# Patient Record
Sex: Male | Born: 1935 | Race: White | Hispanic: No | Marital: Married | Smoking: Former smoker
Health system: Southern US, Community
[De-identification: ages and names within clinical notes are randomized; demographics above are authoritative.]

## PROBLEM LIST (undated history)

## (undated) DIAGNOSIS — I2510 Atherosclerotic heart disease of native coronary artery without angina pectoris: Secondary | ICD-10-CM

## (undated) DIAGNOSIS — G20 Parkinson's disease: Secondary | ICD-10-CM

## (undated) DIAGNOSIS — D649 Anemia, unspecified: Secondary | ICD-10-CM

## (undated) DIAGNOSIS — R809 Proteinuria, unspecified: Secondary | ICD-10-CM

## (undated) DIAGNOSIS — E1149 Type 2 diabetes mellitus with other diabetic neurological complication: Secondary | ICD-10-CM

## (undated) DIAGNOSIS — I219 Acute myocardial infarction, unspecified: Secondary | ICD-10-CM

## (undated) DIAGNOSIS — Z8719 Personal history of other diseases of the digestive system: Secondary | ICD-10-CM

## (undated) DIAGNOSIS — G2581 Restless legs syndrome: Secondary | ICD-10-CM

## (undated) DIAGNOSIS — J309 Allergic rhinitis, unspecified: Secondary | ICD-10-CM

## (undated) DIAGNOSIS — I459 Conduction disorder, unspecified: Secondary | ICD-10-CM

## (undated) DIAGNOSIS — B029 Zoster without complications: Secondary | ICD-10-CM

## (undated) DIAGNOSIS — I480 Paroxysmal atrial fibrillation: Secondary | ICD-10-CM

## (undated) DIAGNOSIS — M1990 Unspecified osteoarthritis, unspecified site: Secondary | ICD-10-CM

## (undated) DIAGNOSIS — K219 Gastro-esophageal reflux disease without esophagitis: Secondary | ICD-10-CM

## (undated) DIAGNOSIS — I10 Essential (primary) hypertension: Secondary | ICD-10-CM

## (undated) DIAGNOSIS — G20A1 Parkinson's disease without dyskinesia, without mention of fluctuations: Secondary | ICD-10-CM

## (undated) DIAGNOSIS — E785 Hyperlipidemia, unspecified: Secondary | ICD-10-CM

## (undated) HISTORY — PX: COLONOSCOPY: SHX174

## (undated) HISTORY — DX: Type 2 diabetes mellitus with other diabetic neurological complication: E11.49

## (undated) HISTORY — DX: Anemia, unspecified: D64.9

## (undated) HISTORY — DX: Paroxysmal atrial fibrillation: I48.0

## (undated) HISTORY — DX: Unspecified osteoarthritis, unspecified site: M19.90

## (undated) HISTORY — DX: Essential (primary) hypertension: I10

## (undated) HISTORY — DX: Allergic rhinitis, unspecified: J30.9

## (undated) HISTORY — PX: ESOPHAGOGASTRODUODENOSCOPY: SHX1529

## (undated) HISTORY — DX: Restless legs syndrome: G25.81

## (undated) HISTORY — PX: PACEMAKER PLACEMENT: SHX43

## (undated) HISTORY — DX: Hyperlipidemia, unspecified: E78.5

## (undated) HISTORY — DX: Gastro-esophageal reflux disease without esophagitis: K21.9

## (undated) HISTORY — DX: Proteinuria, unspecified: R80.9

## (undated) HISTORY — DX: Zoster without complications: B02.9

## (undated) HISTORY — DX: Conduction disorder, unspecified: I45.9

## (undated) HISTORY — DX: Atherosclerotic heart disease of native coronary artery without angina pectoris: I25.10

## (undated) HISTORY — DX: Personal history of other diseases of the digestive system: Z87.19

## (undated) HISTORY — PX: CATARACT EXTRACTION: SUR2

## (undated) HISTORY — DX: Acute myocardial infarction, unspecified: I21.9

## (undated) HISTORY — PX: HERNIA REPAIR: SHX51

---

## 2004-08-22 ENCOUNTER — Ambulatory Visit: Payer: Self-pay | Admitting: Gastroenterology

## 2004-09-03 ENCOUNTER — Inpatient Hospital Stay: Payer: Self-pay | Admitting: Gastroenterology

## 2004-09-03 ENCOUNTER — Other Ambulatory Visit: Payer: Self-pay

## 2004-09-04 ENCOUNTER — Other Ambulatory Visit: Payer: Self-pay

## 2004-11-21 ENCOUNTER — Ambulatory Visit: Payer: Self-pay | Admitting: Gastroenterology

## 2008-12-04 ENCOUNTER — Ambulatory Visit: Payer: Self-pay | Admitting: Unknown Physician Specialty

## 2009-09-05 ENCOUNTER — Ambulatory Visit: Payer: Self-pay | Admitting: Ophthalmology

## 2009-10-24 ENCOUNTER — Ambulatory Visit: Payer: Self-pay | Admitting: Ophthalmology

## 2011-10-17 ENCOUNTER — Ambulatory Visit: Payer: Self-pay | Admitting: Gastroenterology

## 2011-11-06 ENCOUNTER — Ambulatory Visit: Payer: Self-pay | Admitting: Gastroenterology

## 2011-11-10 LAB — PATHOLOGY REPORT

## 2013-01-29 ENCOUNTER — Emergency Department: Payer: Self-pay | Admitting: Emergency Medicine

## 2013-01-29 ENCOUNTER — Ambulatory Visit: Payer: Self-pay | Admitting: Emergency Medicine

## 2013-02-01 ENCOUNTER — Emergency Department: Payer: Self-pay | Admitting: Emergency Medicine

## 2013-02-11 ENCOUNTER — Ambulatory Visit: Payer: Self-pay | Admitting: Family Medicine

## 2013-06-27 ENCOUNTER — Inpatient Hospital Stay: Payer: Self-pay | Admitting: Internal Medicine

## 2013-06-27 LAB — COMPREHENSIVE METABOLIC PANEL
Albumin: 3.4 g/dL (ref 3.4–5.0)
Anion Gap: 5 — ABNORMAL LOW (ref 7–16)
Bilirubin,Total: 0.5 mg/dL (ref 0.2–1.0)
Co2: 29 mmol/L (ref 21–32)
Creatinine: 0.71 mg/dL (ref 0.60–1.30)
EGFR (African American): >60
Glucose: 161 mg/dL — ABNORMAL HIGH (ref 65–99)
SGPT (ALT): 23 U/L (ref 12–78)
Sodium: 139 mmol/L (ref 136–145)

## 2013-06-27 LAB — CBC
MCH: 31.8 pg (ref 26.0–34.0)
Platelet: 128 10*3/uL — ABNORMAL LOW (ref 150–440)
RBC: 4.29 10*6/uL — ABNORMAL LOW (ref 4.40–5.90)
RDW: 13.3 % (ref 11.5–14.5)
WBC: 7.9 10*3/uL (ref 3.8–10.6)

## 2013-06-27 LAB — TSH: Thyroid Stimulating Horm: 2.51 u[IU]/mL

## 2013-06-27 LAB — TROPONIN I: Troponin-I: <0.02 ng/mL

## 2013-06-27 LAB — APTT
Activated PTT: 32.9 secs (ref 23.6–35.9)
Activated PTT: 100.4 secs — ABNORMAL HIGH (ref 23.6–35.9)

## 2013-06-28 LAB — BASIC METABOLIC PANEL
Anion Gap: 5 — ABNORMAL LOW (ref 7–16)
BUN: 17 mg/dL (ref 7–18)
Calcium, Total: 8.8 mg/dL (ref 8.5–10.1)
Co2: 28 mmol/L (ref 21–32)
Creatinine: 0.86 mg/dL (ref 0.60–1.30)
EGFR (African American): >60
EGFR (Non-African Amer.): >60
Glucose: 153 mg/dL — ABNORMAL HIGH (ref 65–99)
Potassium: 4 mmol/L (ref 3.5–5.1)
Sodium: 139 mmol/L (ref 136–145)

## 2013-06-28 LAB — CBC WITH DIFFERENTIAL/PLATELET
Basophil #: 0 10*3/uL (ref 0.0–0.1)
Basophil %: 0.5 %
Eosinophil %: 2 %
HGB: 13.1 g/dL (ref 13.0–18.0)
Lymphocyte #: 2 10*3/uL (ref 1.0–3.6)
MCH: 31.8 pg (ref 26.0–34.0)
MCHC: 34.8 g/dL (ref 32.0–36.0)
Monocyte #: 0.5 x10 3/mm (ref 0.2–1.0)
Monocyte %: 7.4 %
Neutrophil #: 4.1 10*3/uL (ref 1.4–6.5)
Neutrophil %: 60 %
Platelet: 131 10*3/uL — ABNORMAL LOW (ref 150–440)
WBC: 6.8 10*3/uL (ref 3.8–10.6)

## 2013-06-28 LAB — TSH: Thyroid Stimulating Horm: 2.55 u[IU]/mL

## 2013-06-28 LAB — LIPID PANEL
Cholesterol: 164 mg/dL (ref 0–200)
Ldl Cholesterol, Calc: 76 mg/dL (ref 0–100)
VLDL Cholesterol, Calc: 29 mg/dL (ref 5–40)

## 2014-03-04 DIAGNOSIS — E785 Hyperlipidemia, unspecified: Secondary | ICD-10-CM | POA: Insufficient documentation

## 2014-03-04 DIAGNOSIS — I10 Essential (primary) hypertension: Secondary | ICD-10-CM | POA: Insufficient documentation

## 2014-03-04 DIAGNOSIS — E119 Type 2 diabetes mellitus without complications: Secondary | ICD-10-CM | POA: Insufficient documentation

## 2014-03-04 DIAGNOSIS — J309 Allergic rhinitis, unspecified: Secondary | ICD-10-CM | POA: Insufficient documentation

## 2014-03-04 DIAGNOSIS — K219 Gastro-esophageal reflux disease without esophagitis: Secondary | ICD-10-CM | POA: Insufficient documentation

## 2014-03-04 DIAGNOSIS — G2581 Restless legs syndrome: Secondary | ICD-10-CM | POA: Insufficient documentation

## 2014-03-04 DIAGNOSIS — I2510 Atherosclerotic heart disease of native coronary artery without angina pectoris: Secondary | ICD-10-CM | POA: Insufficient documentation

## 2014-03-04 DIAGNOSIS — G629 Polyneuropathy, unspecified: Secondary | ICD-10-CM | POA: Insufficient documentation

## 2014-03-06 DIAGNOSIS — I480 Paroxysmal atrial fibrillation: Secondary | ICD-10-CM | POA: Insufficient documentation

## 2014-03-06 DIAGNOSIS — R809 Proteinuria, unspecified: Secondary | ICD-10-CM | POA: Insufficient documentation

## 2014-03-06 DIAGNOSIS — D649 Anemia, unspecified: Secondary | ICD-10-CM | POA: Insufficient documentation

## 2014-04-07 DIAGNOSIS — G20 Parkinson's disease: Secondary | ICD-10-CM | POA: Insufficient documentation

## 2014-04-10 ENCOUNTER — Ambulatory Visit: Payer: Self-pay | Admitting: Neurology

## 2014-12-29 NOTE — H&P (Signed)
PATIENT NAME:  Johnny Bowen, Johnny Bowen MR#:  [id number] DATE OF BIRTH:  [birth year]  DATE OF ADMISSION:  06/27/2013  PRIMARY CARE PHYSICIAN: Dr. Mickey Farber at [facility], [place].   PRIMARY CARDIOLOGIST: Dr. Harold Hedge at [facility], [place].   REFERRING: Emergency Room physician: Dr. Jene Every.   CHIEF COMPLAINT:  Dizziness.   HISTORY OF PRESENT ILLNESS: An 81 year old male with a past medical history of hypertension, acute myocardial infarction 2 times 27 years ago, diabetes type 2 and neuropathy and hyperlipidemia, who is living active and healthy life, had been feeling dizzy on and off for the last 3 to 4 weeks and was referred to Dr. Lady Gary. Dr. Lady Gary gave him a Holter monitor for 24 hours which did not report any significant events. The plan was to follow-up in the clinic next week, but meanwhile since yesterday evening, he started having very frequently this type of episodes where he feels his head is light and he will pass out, but he never had actual episode of loss of consciousness. He had almost 7 to 8 episodes overnight without any chest pain or palpitations feeling, and so he decided to come to the Emergency Room today. In the Emergency Room he was found having atrial flutter, which is new. His ventricular response was slow and he had almost 6 second pause, which is recorded in the ER monitoring, and so the ER physician spoke to Dr. Lady Gary. He advised to admit the patient and we will plan for further management.   REVIEW OF SYSTEMS:  CONSTITUTIONAL: Negative for fever, fatigue, weakness, pain or weight loss.  EYES: No blurring, double vision, pain or redness.  EARS, NOSE, THROAT: No tinnitus, ear pain or hearing loss.  RESPIRATORY: No cough, wheezing, hemoptysis or shortness of breath.  CARDIOVASCULAR: No chest pain, edema, arrhythmia or palpitations feeling but was feeling dizzy.  GASTROINTESTINAL: No nausea, vomiting, diarrhea or abdominal pain  GENITOURINARY: No dysuria,  hematuria or increased frequency.  ENDOCRINE: No increased sweating, heat or cold intolerance.  MUSCULOSKELETAL: No pain or swelling in the joints.  NEUROLOGICAL: No numbness, weakness, dysarthria or epilepsy tremors or vertigo.   PSYCHIATRIC: No anxiety, insomnia or bipolar disorder.   PAST MEDICAL HISTORY:  1. Diabetes type 2 and neuropathy.  2. Hyperlipidemia.  3. Restless leg syndrome.  4. Acute myocardial infarction 2 times, which was 27 years ago and on cardiac catheterization he was told that the part of the heart which is affected is already dead and so they did not put any stent or did not suggest any surgery.  5. Hypertension.  6. Hypercholesterolemia.   PAST SURGICAL HISTORY: Hernia surgery many years ago.   ALLERGIES: No medications.   SOCIAL HISTORY: Denies any drinking, smoking habits or any illegal drug use. He was a Engineer, civil (consulting) and retired currently. Lives with his wife.   FAMILY HISTORY: Positive for diabetes.   HOME MEDICATIONS:  1. Pravastatin 40 mg oral once a day.  2. Pramipexole 0.75 mg oral tablet once a day.  3. Atenolol 50 mg once a day.  4. Aspirin 81 mg once a day.  5. Acetaminophen and oxycodone 10 mg oral tablet every six hours as needed for pain.   PHYSICAL EXAMINATION: VITAL SIGNS: In the ER, temperature 97.7, pulse 63, respirations 18, blood pressure 153/78 and pulse oximetry 95% on room air.  GENERAL: The patient is fully alert and oriented to time, place and person and he is cooperative with history taking and physical examination.  HEENT:  Head and neck atraumatic. Conjunctiva pink. Oral mucosa moist.  NECK: Supple. No JVD.  RESPIRATORY: Bilateral clear and equal air entry.  CARDIOVASCULAR: S1, S2 present. Irregular. No murmur appreciated.   ABDOMEN: Soft, nontender. Bowel sounds present. No organomegaly.  SKIN: No rashes.  LEGS: No edema.  NEUROLOGICAL: Power 5/5. Follows commands. No tremors or rigidity.  PSYCHIATRIC: Does not  appear in any acute psychiatric illness at this time.   IMPORTANT LABORATORY RESULTS: Glucose 161, BUN 14, creatinine 0.71, sodium 139, potassium 4.1, chloride 105, CO2 of 29, anion gap 5, calcium 8.9.   Total protein 6.5, albumin 3.4, bilirubin 0.5 alkaline phosphate 82, SGOT 20 and SGPT 23. Troponin less than 0.02. TSH 2.51. WBC 7.9, hemoglobin 13.7, platelet count 128 and MCV is 92.   Chest x-ray, portable, shows a mild elevation of the right hemidiaphragm may be associated with basilar atelectasis. No evidence of pneumonia. Follow-up PA and lateral chest x-ray with deep inspiration would be of  value.   EKG: As per ER shows atrial flutter with irregular ventricular response, premature ventricular conduction. Also, the ventricular response rate is around 70 and there is a tracing by ER which shows almost 6 second pause with atrial fibrillation baseline activities.   ASSESSMENT AND PLAN: An 81 year old male with past medical history of remote acute myocardial infarction in the past, hypertension, hyperlipidemia, diabetes, which is diet controlled, presented to the Emergency Room with episode of feeling dizzy for the last 3 to 4 weeks and had undergone Holter monitoring for 24 hours without any significant racing with increased episode for the last few hours and came to the Emergency Room, found having atrial flutter, which is new and also had a pause which is recorded of up to 6 seconds.  1. Atrial flutter. This is new onset. There is some intraventricular conduction delay and ventricular response rate is around 60 to 70. The patient also had a pause, so we will hold beta blocker and there is no need for rate control at this time. Blood pressure is stable. We will follow serial troponins, monitor him on telemetry and give heparin IV drip. I spoke to Dr. Lady Gary and he agrees to see the patient today as soon as possible. Meanwhile, we will get echocardiogram.  2. Atrioventricular block and pause. The  patient has almost 6 second pause in tracings in ER. We will hold his atenolol at this time and monitor on telemetry. Blood pressure is stable. The patient is fully alert and oriented.  3. Hypertension. The patient was taking atenolol. The patient is currently stable so we will hold atenolol because of cardiac reasons.  4. Diabetes type 2. The patient says his diabetes was diet controlled. We will  check his blood sugar and give insulin sliding scale coverage if needed.  5. Hyperlipidemia. We will continue pravastatin in the hospital.   CONDITION ON ADMISSION: Stable.   CODE STATUS: FULL CODE.   TOTAL TIME SPENT ON THIS ADMISSION: 50 minutes.     ____________________________ Hope Pigeon Elisabeth Pigeon, MD vgv:sg D: 06/27/2013 10:16:02 ET T: 06/27/2013 11:41:24 ET JOB#: [id number]  cc: Hope Pigeon. Elisabeth Pigeon, MD, <Dictator> Darlin Priestly. Lady Gary, MD Altamese Dilling MD ELECTRONICALLY SIGNED 07/06/2013 13:59

## 2014-12-29 NOTE — Consult Note (Signed)
   Present Illness Patient is an 81 year old male with history of coronary artery disease status post PCI who presents for followup visit. Has hyperlipidemia as well as restless leg syndrome. The patient was evalluated as an outptient wiht a holter monitor that was negative for any arrythmia although, the patient had no symptoms with this monitor in place. He states the symptoms last approximately 10 seconds. Pt presented to the er with complaints of intermittant symptoms again over the past 12 hours. While in the er, he atrial flutter with several 5 second pauses. These pauses reproduced his symptoms.   Physical Exam:  GEN well developed, well nourished, no acute distress   HEENT red conjunctivae   NECK supple   RESP normal resp effort   CARD Regular rate and rhythm  Bradycardic   ABD denies tenderness  normal BS   LYMPH negative axillae   EXTR negative cyanosis/clubbing, negative edema   SKIN normal to palpation   NEURO cranial nerves intact, motor/sensory function intact   PSYCH A+O to time, place, person   Review of Systems:  Subjective/Chief Complaint near syncope   General: No Complaints   Skin: No Complaints   ENT: No Complaints   Eyes: No Complaints   Neck: No Complaints   Respiratory: No Complaints   Cardiovascular: Palpitations   Gastrointestinal: No Complaints   Genitourinary: No Complaints   Vascular: No Complaints   Musculoskeletal: No Complaints   Neurologic: Dizzness   Hematologic: No Complaints   Endocrine: No Complaints   Psychiatric: No Complaints   Review of Systems: All other systems were reviewed and found to be negative   Medications/Allergies Reviewed Medications/Allergies reviewed   Lab Results: Routine Chem:  20-Oct-14 07:46   eGFR (African American) >60   EKG:  Interpretation atrial flutter with variable ventricular resone with several 4-5 sec. pauses.    No Known Allergies:    Impression 81 yo male with history of  cad s/p pci wiht recent progressive episodes of near syncopoe. Pt presented ot the er after having several near syncopal episodes last 12 hours. He was noted to have a fluter which was new wiht intermittant 4-5 second pasues which appear to be secondary to delayed atrial recovery time. Pt has been on atenolol. Will hold this but ptient will need ppm.   Plan 1 Hold atenolol 2 Monitor on telemetry 3. Risk and benefits of ppm discussed 4. Proceed with dual chamber ppm. will resume beta blocker post procedure 5. Furhter recs pending course.   Electronic Signatures: Teodoro Spray (MD)  (Signed 20-Oct-14 15:44)  Authored: General Aspect/Present Illness, History and Physical Exam, Review of System, Labs, EKG , Allergies, Impression/Plan   Last Updated: 20-Oct-14 15:44 by Teodoro Spray (MD)

## 2014-12-29 NOTE — Op Note (Signed)
PATIENT NAME:  Johnny Bowen, Johnny Bowen MR#:  [id number] DATE OF BIRTH:  24-Jan-1930  DATE OF PROCEDURE:  06/28/2013  REFERRING PHYSICIAN: Hope Pigeon. Elisabeth Pigeon, MD  PREPROCEDURE DIAGNOSIS: Sick sinus syndrome.  PROCEDURE: Dual-chamber pacemaker implantation.  POSTPROCEDURE DIAGNOSIS: Atrial sensing with ventricular pacing.  INDICATION: The patient is an 81 year old gentleman with known history of coronary artery disease and paroxysmal atrial fibrillation/flutter. The patient presented with near-syncope, was noted to be in atrial fibrillation/flutter with 6-second pauses.   PROCEDURE: Risks, benefits and alternatives of permanent pacemaker implantation were explained to the patient. Informed consent was obtained. He was brought to the operating room in fasting state. The left pectoral region was prepped and draped in the usual sterile manner. Anesthesia was obtained with 1% lidocaine locally. A 6-cm incision was performed over the left pectoral region. The pacemaker pocket was generated by electrocautery and blunt dissection. Medtronic ventricular and atrial leads were positioned to right ventricular apical septum and right atrial appendage under fluoroscopic guidance. After proper thresholds were obtained, the leads were sutured in place. The pacemaker pocket was irrigated with gentamicin solution. The leads were connected to a dual-chamber rate-responsive pacemaker generator (Medtronic Adapta [id number]) and positioned into the pocket. The pocket was closed with 2-0 and 4-0 Vicryl, respectively. Steri-Strips and a pressure dressing were applied.    ____________________________ Marcina Millard, MD ap:jm D: 06/28/2013 15:04:03 ET T: 06/28/2013 15:25:22 ET JOB#: [id number]  cc: Marcina Millard, MD, <Dictator> Marcina Millard MD ELECTRONICALLY SIGNED 07/27/2013 9:31

## 2014-12-29 NOTE — Discharge Summary (Signed)
PATIENT NAME:  Johnny Bowen, Johnny Bowen MR#:  [id number] DATE OF BIRTH:  [birth year]  DATE OF ADMISSION:  06/27/2013 DATE OF DISCHARGE:  06/29/2013  DISCHARGE DIAGNOSES:  1.  New-onset atrial flutter.  2.  Sick sinus syndrome, status post pacemaker placement.  3.  Hypertension.  4.  Dizziness.   CODE STATUS: Full code.   DISCHARGE MEDICATIONS:  1.  Aspirin 81 mg delayed-release tablet.  2.  Pravastatin 40 mg once a day.  3.  Pramipexole 1 mg oral 2 tablets 2 times a day.  4.  Prilosec 20 mg delayed-release once a day. 5.  Fluticasone nasal spray two spray once a day.  6.  OxyContin 10 mg oral extended release once a day at bedtime.  7.  Doxepin 75 mg oral capsule 2 capsules orally once a day.  8.  Diltiazem extended release 180 mg 24-hour capsule once a day.   DIET ON DISCHARGE: Low sodium.   DIET CONSISTENCY: Regular.   ACTIVITY: As tolerated.   TIMEFRAME TO FOLLOW-UP: Within 1 to 2 weeks with Dr. Lady Gary in clinic in cardiology.   HISTORY OF PRESENTING ILLNESS: H and P done on 20 October by me. An 81 year old male with past history of hypertension, MI, diabetes, diabetic neuropathy, and hyperlipidemia. He was feeling wheezy for the last 3 to 4 weeks, and so was seen by Dr. Lady Gary. Was given a Holter monitor, but was no significant events on that.  Had multiple episodes on the previous day of presentation, and so decided to come to the Emergency Room. There was no chest pain. In the ER, he was found having atrial flutter, and he had pauses for almost 6 seconds, so admitted for further management. Dr. Lady Gary was contacted for this, initially for atrial flutter. He was started on heparin drip and monitored on telemetry.   He again had multiple pauses, and so Dr. Lady Gary decided to put a permanent pacemaker for sick sinus syndrome, and he advised to discharge him on diltiazem to control flutter rate and aspirin for further management of this issue. The patient remained stable and we advised to go home  with follow-up in cardiology clinic.   IMPORTANT LABORATORY RESULTS IN THE HOSPITAL: Troponin was negative, 0.02. Echocardiogram was done, which showed ejection fraction 45% to 50% with mild to moderate increased ventricular internal cavity size, mild dilated left atrium.   TOTAL TIME SPENT ON THIS DISCHARGE: 45 minutes.    ____________________________ Hope Pigeon Elisabeth Pigeon, MD vgv:cg D: 07/04/2013 00:00:29 ET T: 07/04/2013 04:45:46 ET JOB#: [id number]  cc: Hope Pigeon. Elisabeth Pigeon, MD, <Dictator> Darlin Priestly. Lady Gary, MD Altamese Dilling MD ELECTRONICALLY SIGNED 07/06/2013 14:00

## 2015-07-26 ENCOUNTER — Encounter: Payer: Self-pay | Admitting: *Deleted

## 2015-07-26 DIAGNOSIS — I219 Acute myocardial infarction, unspecified: Secondary | ICD-10-CM | POA: Insufficient documentation

## 2015-07-26 DIAGNOSIS — I459 Conduction disorder, unspecified: Secondary | ICD-10-CM | POA: Insufficient documentation

## 2015-07-31 ENCOUNTER — Encounter: Payer: Self-pay | Admitting: *Deleted

## 2015-07-31 ENCOUNTER — Ambulatory Visit (INDEPENDENT_AMBULATORY_CARE_PROVIDER_SITE_OTHER): Payer: Medicare Other | Admitting: Obstetrics and Gynecology

## 2015-07-31 VITALS — BP 120/67 | HR 76 | Resp 16 | Ht 69.0 in | Wt 187.2 lb

## 2015-07-31 DIAGNOSIS — R109 Unspecified abdominal pain: Secondary | ICD-10-CM

## 2015-07-31 DIAGNOSIS — R1909 Other intra-abdominal and pelvic swelling, mass and lump: Secondary | ICD-10-CM | POA: Diagnosis not present

## 2015-07-31 DIAGNOSIS — N390 Urinary tract infection, site not specified: Secondary | ICD-10-CM | POA: Diagnosis not present

## 2015-07-31 LAB — MICROSCOPIC EXAMINATION
RBC MICROSCOPIC, UA: NONE SEEN /HPF (ref 0–?)
WBC UA: NONE SEEN /HPF (ref 0–?)

## 2015-07-31 LAB — BLADDER SCAN AMB NON-IMAGING: Scan Result: 47

## 2015-07-31 LAB — URINALYSIS, COMPLETE
BILIRUBIN UA: NEGATIVE
Glucose, UA: NEGATIVE
LEUKOCYTES UA: NEGATIVE
Nitrite, UA: NEGATIVE
Protein, UA: NEGATIVE
RBC UA: NEGATIVE
SPEC GRAV UA: 1.025 (ref 1.005–1.030)
Urobilinogen, Ur: 0.2 mg/dL (ref 0.2–1.0)
pH, UA: 5 (ref 5.0–7.5)

## 2015-07-31 NOTE — Progress Notes (Signed)
07/31/2015 8:14 PM   Ivie Myra Gianotti 09/07/1930 [id number]  Referring provider: No referring provider defined for this encounter.  Chief Complaint  Patient presents with  . Urinary Tract Infection  . Establish Care    HPI: Patient is an 81yo male with a hisotry of Parkinson's disease, CAD, MI and DM presenting today as a referral in his primary care provider for recurrent urinary tract infections over the last 4 months. Patient's wife states he originally presented with symptoms of severe malaise and altered mental status. He was found to have a urinary tract infection and treated with Levaquin.  UC E. Coli + on 07/03/15 and 03/09/15 Negative culture on 07/26/15 following treatment with Levaquin. 07/03/15 Cr 1.1  Denies any urinary symptoms at baseline. Specifically he denies urinary frequency, urgency, gross hematuria, hesitancy or sensation of incomplete bladder emptying.  No history of GU or prostate cancers.No history of renal stones.  Minimal smoking history (occasional cigars).  No chemical exposures.  S/p bilateral inguinal hernia repair in the 1980s.  H2O intake 3 16oz bottle of water per day.  PMH: Past Medical History  Diagnosis Date  . Allergic rhinitis   . Anemia   . Arthritis   . CAD (coronary artery disease)   . Diabetes mellitus with neurological manifestations (HCC)   . Episodic atrial fibrillation (HCC)   . HTN (hypertension)   . GERD (gastroesophageal reflux disease)   . Heart block   . History of lower GI bleeding   . HLD (hyperlipidemia)   . MI (myocardial infarction) (HCC)   . Proteinuria   . RLS (restless legs syndrome)   . Shingles     Surgical History: Past Surgical History  Procedure Laterality Date  . Hernia repair    . Pacemaker placement    . Colonoscopy    . Esophagogastroduodenoscopy      Home Medications:    Medication List       This list is accurate as of: 07/31/15  8:14 PM.  Always use your most recent med list.                carbidopa-levodopa 25-100 MG tablet  Commonly known as:  SINEMET IR  Take by mouth.     ELIQUIS 5 MG Tabs tablet  Generic drug:  apixaban  Take by mouth.     fluticasone 50 MCG/ACT nasal spray  Commonly known as:  FLONASE  Place into the nose.     latanoprost 0.005 % ophthalmic solution  Commonly known as:  XALATAN  Apply to eye.     metFORMIN 500 MG tablet  Commonly known as:  GLUCOPHAGE     nitroGLYCERIN 0.4 MG SL tablet  Commonly known as:  NITROSTAT  Place under the tongue.     Omeprazole 20 MG Tbec  Take by mouth.     oxyCODONE 5 MG immediate release tablet  Commonly known as:  Oxy IR/ROXICODONE  1 po tid prn Earliest Fill Date: 07/25/15     pramipexole 1 MG tablet  Commonly known as:  MIRAPEX  Take by mouth.     pravastatin 40 MG tablet  Commonly known as:  PRAVACHOL  Take by mouth.     pregabalin 100 MG capsule  Commonly known as:  LYRICA  1 po qHS        Allergies:  Allergies  Allergen Reactions  . Atorvastatin     Other reaction(s): Other (See Comments) Restless leg syndrome  . Esomeprazole Magnesium     Other  reaction(s): Other (See Comments) Stomach irritation    Family History: No family history on file.  Social History:  reports that he quit smoking about 20 years ago. He does not have any smokeless tobacco history on file. He reports that he does not drink alcohol. His drug history is not on file.  ROS: UROLOGY Frequent Urination?: No Hard to postpone urination?: Yes Burning/pain with urination?: No Get up at night to urinate?: Yes Leakage of urine?: Yes Urine stream starts and stops?: Yes Trouble starting stream?: No Do you have to strain to urinate?: No Blood in urine?: No Urinary tract infection?: Yes Sexually transmitted disease?: No Injury to kidneys or bladder?: No Painful intercourse?: No Weak stream?: No Erection problems?: No Penile pain?: No  Gastrointestinal Nausea?: No Vomiting?:  No Indigestion/heartburn?: No Diarrhea?: No Constipation?: No  Constitutional Fever: No Night sweats?: No Weight loss?: Yes Fatigue?: Yes  Skin Skin rash/lesions?: No Itching?: No  Eyes Blurred vision?: Yes Double vision?: No  Ears/Nose/Throat Sore throat?: No Sinus problems?: No  Hematologic/Lymphatic Swollen glands?: No Easy bruising?: Yes  Cardiovascular Leg swelling?: Yes Chest pain?: No  Respiratory Cough?: No Shortness of breath?: No  Endocrine Excessive thirst?: No  Musculoskeletal Back pain?: No Joint pain?: No  Neurological Headaches?: No Dizziness?: No  Psychologic Depression?: No Anxiety?: No  Physical Exam: BP 120/67 mmHg  Pulse 76  Resp 16  Ht 5\' 9"  (1.753 m)  Wt 187 lb 3.2 oz (84.913 kg)  BMI 27.63 kg/m2  Constitutional:  Alert and oriented, No acute distress, tremor at rest HEENT: [place] AT, moist mucus membranes.  Trachea midline, no masses. Cardiovascular: No clubbing, cyanosis, or edema. Respiratory: Normal respiratory effort, no increased work of breathing. GI: Abdomen is soft, nontender, nondistended, no abdominal masses GU: No CVA tenderness.  Uncircumcised phallus, easily retractable foreskin, small bilateral hydroceles, small left inguinal mass, DRE: prostate small and boggy, nontender Skin: No rashes, bruises or suspicious lesions. Lymph: No cervical or inguinal adenopathy. Neurologic: Grossly intact, no focal deficits, moving all 4 extremities. Psychiatric: Normal mood and affect.  Laboratory Data:   Urinalysis Results for orders placed or performed in visit on 07/31/15  Microscopic Examination  Result Value Ref Range   WBC, UA None seen 0 -  5 /hpf   RBC, UA None seen 0 -  2 /hpf   Epithelial Cells (non renal) 0-10 0 - 10 /hpf   Crystals Present (A) N/A   Crystal Type Amorphous Sediment N/A   Mucus, UA Present (A) Not Estab.   Bacteria, UA Few (A) None seen/Few  Urinalysis, Complete  Result Value Ref Range    Specific Gravity, UA 1.025 1.005 - 1.030   pH, UA 5.0 5.0 - 7.5   Color, UA Yellow Yellow   Appearance Ur Cloudy (A) Clear   Leukocytes, UA Negative Negative   Protein, UA Negative Negative/Trace   Glucose, UA Negative Negative   Ketones, UA Trace (A) Negative   RBC, UA Negative Negative   Bilirubin, UA Negative Negative   Urobilinogen, Ur 0.2 0.2 - 1.0 mg/dL   Nitrite, UA Negative Negative   Microscopic Examination See below:   BLADDER SCAN AMB NON-IMAGING  Result Value Ref Range   Scan Result 47 mL     Pertinent Imaging:   Assessment & Plan:    1. UTI (lower urinary tract infection)- PVR 69mL.  Calcium oxalate crystals seen on UA today otherwise unremarkable. Patient denies previous history of renal stones. CT renal stone study ordered for further evaluation  of possible stones as nidus for recurrent urinary tract infection. Patient is uncircumcised. Good perineal hygiene practices reviewed. -CT renal stone study - Urinalysis, Complete - BLADDER SCAN AMB NON-IMAGING  2. Inguinal mass- patient status post bilateral inguinal hernia repairs in the 1980s. Possible hernia palpated left inguinal canal. Area is nontender. We'll obtain a scrotal ultrasound.    Return for return for CT and [place] results.  These notes generated with voice recognition software. I apologize for typographical errors.  Earlie Lou, FNP  [facility] [address], [address] [place], [place] [address] [phone]

## 2015-08-09 ENCOUNTER — Ambulatory Visit
Admission: RE | Admit: 2015-08-09 | Discharge: 2015-08-09 | Disposition: A | Discharge status: Home / Self Care | Payer: Medicare Other | Source: Ambulatory Visit | Attending: Obstetrics and Gynecology | Admitting: Obstetrics and Gynecology

## 2015-08-09 DIAGNOSIS — R1909 Other intra-abdominal and pelvic swelling, mass and lump: Secondary | ICD-10-CM | POA: Insufficient documentation

## 2015-08-09 DIAGNOSIS — N390 Urinary tract infection, site not specified: Secondary | ICD-10-CM | POA: Diagnosis present

## 2015-08-09 DIAGNOSIS — R109 Unspecified abdominal pain: Secondary | ICD-10-CM | POA: Diagnosis present

## 2015-08-22 ENCOUNTER — Ambulatory Visit (INDEPENDENT_AMBULATORY_CARE_PROVIDER_SITE_OTHER): Payer: Medicare Other | Admitting: Obstetrics and Gynecology

## 2015-08-22 ENCOUNTER — Encounter: Payer: Self-pay | Admitting: Obstetrics and Gynecology

## 2015-08-22 VITALS — BP 159/75 | HR 74 | Resp 16 | Ht 69.0 in | Wt 186.0 lb

## 2015-08-22 DIAGNOSIS — R3129 Other microscopic hematuria: Secondary | ICD-10-CM | POA: Diagnosis not present

## 2015-08-22 DIAGNOSIS — N390 Urinary tract infection, site not specified: Secondary | ICD-10-CM | POA: Diagnosis not present

## 2015-08-22 LAB — MICROSCOPIC EXAMINATION

## 2015-08-22 LAB — URINALYSIS, COMPLETE
BILIRUBIN UA: NEGATIVE
Glucose, UA: NEGATIVE
Ketones, UA: NEGATIVE
LEUKOCYTES UA: NEGATIVE
Nitrite, UA: NEGATIVE
PH UA: 5.5 (ref 5.0–7.5)
PROTEIN UA: NEGATIVE
RBC, UA: NEGATIVE
Specific Gravity, UA: 1.02 (ref 1.005–1.030)
Urobilinogen, Ur: 0.2 mg/dL (ref 0.2–1.0)

## 2015-08-22 NOTE — Progress Notes (Signed)
08/22/2015 1:15 PM   Johnny Bowen 29-Nov-1929 [id number]  Referring provider: Mickey Farber, MD [address] [facility] [place], [place] [address]  Chief Complaint  Patient presents with  . Results    CT & [place]  . Recurrent UTI    HPI: Patient is a 81 year old male with a history of recurrent urinary tract infections presenting today with his wife to review his CT renal stone study results as well as a scrotal ultrasound findings.  Renal ultrasound was unremarkable. CT scan showed a possible 2-3 mm right distal ureteral stone without signs of obstruction.   Previous History: Patient is an 81yo male with a hisotry of Parkinson's disease, CAD, MI and DM presenting today as a referral in his primary care provider for recurrent urinary tract infections over the last 4 months. Patient's wife states he originally presented with symptoms of severe malaise and altered mental status. He was found to have a urinary tract infection and treated with Levaquin.  UC E. Coli + on 07/03/15 and 03/09/15 Negative culture on 07/26/15 following treatment with Levaquin. 07/03/15 Cr 1.1  Denies any urinary symptoms at baseline. Specifically he denies urinary frequency, urgency, gross hematuria, hesitancy or sensation of incomplete bladder emptying.  No history of GU or prostate cancers. No history of renal stones. Minimal smoking history (occasional cigars). No chemical exposures.  S/p bilateral inguinal hernia repair in the 1980s.  H2O intake 3 16oz bottle of water per day.    PMH: Past Medical History  Diagnosis Date  . Allergic rhinitis   . Anemia   . Arthritis   . CAD (coronary artery disease)   . Diabetes mellitus with neurological manifestations (HCC)   . Episodic atrial fibrillation (HCC)   . HTN (hypertension)   . GERD (gastroesophageal reflux disease)   . Heart block   . History of lower GI bleeding   . HLD (hyperlipidemia)   . MI (myocardial infarction) (HCC)   .  Proteinuria   . RLS (restless legs syndrome)   . Shingles     Surgical History: Past Surgical History  Procedure Laterality Date  . Hernia repair    . Pacemaker placement    . Colonoscopy    . Esophagogastroduodenoscopy      Home Medications:    Medication List       This list is accurate as of: 08/22/15  1:15 PM.  Always use your most recent med list.               amiodarone 200 MG tablet  Commonly known as:  PACERONE  Take by mouth.     carbidopa-levodopa 25-100 MG tablet  Commonly known as:  SINEMET IR  Take by mouth.     doxepin 75 MG capsule  Commonly known as:  SINEQUAN     ELIQUIS 5 MG Tabs tablet  Generic drug:  apixaban  Take by mouth.     fluticasone 50 MCG/ACT nasal spray  Commonly known as:  FLONASE  Place into the nose.     latanoprost 0.005 % ophthalmic solution  Commonly known as:  XALATAN  Apply to eye.     metFORMIN 500 MG tablet  Commonly known as:  GLUCOPHAGE     nitroGLYCERIN 0.4 MG SL tablet  Commonly known as:  NITROSTAT  Place under the tongue.     Omeprazole 20 MG Tbec  Take by mouth.     oxyCODONE 5 MG immediate release tablet  Commonly known as:  Oxy IR/ROXICODONE  1 po tid prn Earliest Fill Date: 07/25/15     pramipexole 1 MG tablet  Commonly known as:  MIRAPEX  Take by mouth.     pravastatin 40 MG tablet  Commonly known as:  PRAVACHOL  Take by mouth.     pregabalin 100 MG capsule  Commonly known as:  LYRICA  1 po qHS        Allergies:  Allergies  Allergen Reactions  . Atorvastatin     Other reaction(s): Other (See Comments) Restless leg syndrome  . Esomeprazole Magnesium     Other reaction(s): Other (See Comments) Stomach irritation    Family History: History reviewed. No pertinent family history.  Social History:  reports that he quit smoking about 20 years ago. He does not have any smokeless tobacco history on file. He reports that he does not drink alcohol. His drug history is not on  file.  ROS: UROLOGY Frequent Urination?: No Hard to postpone urination?: No Burning/pain with urination?: No Get up at night to urinate?: No Leakage of urine?: No Urine stream starts and stops?: No Trouble starting stream?: No Do you have to strain to urinate?: No Blood in urine?: No Urinary tract infection?: No Sexually transmitted disease?: No Injury to kidneys or bladder?: No Painful intercourse?: No Weak stream?: No Erection problems?: No Penile pain?: No  Gastrointestinal Nausea?: No Vomiting?: No Indigestion/heartburn?: No Diarrhea?: No Constipation?: No  Constitutional Fever: No Night sweats?: No Weight loss?: No Fatigue?: No  Skin Skin rash/lesions?: No Itching?: No  Eyes Blurred vision?: No Double vision?: No  Ears/Nose/Throat Sore throat?: No Sinus problems?: No  Hematologic/Lymphatic Swollen glands?: No Easy bruising?: No  Cardiovascular Leg swelling?: No Chest pain?: No  Respiratory Cough?: No Shortness of breath?: No  Endocrine Excessive thirst?: No  Musculoskeletal Back pain?: No Joint pain?: No  Neurological Headaches?: No Dizziness?: No  Psychologic Depression?: No Anxiety?: No  Physical Exam: BP 159/75 mmHg  Pulse 74  Resp 16  Ht  (1.753 m)  Wt 186 lb (84.369 kg)  BMI 27.45 kg/m2  Constitutional:  Alert and oriented,  No acute distress. HEENT:  AT, moist mucus membranes.  Trachea midline, no masses. Cardiovascular: No clubbing, cyanosis, or edema. Respiratory: Normal respiratory effort, no increased work of breathing. Skin: No rashes, bruises or suspicious lesions. Lymph: No cervical or inguinal adenopathy. Neurologic:  moving upper extremities. Psychiatric: Normal mood,  Flat affect.  Laboratory Data:   Urinalysis    Component Value Date/Time   GLUCOSEU Negative 07/31/2015 1042   BILIRUBINUR Negative 07/31/2015 1042   NITRITE Negative 07/31/2015 1042   LEUKOCYTESUR Negative 07/31/2015 1042     Pertinent Imaging: CLINICAL DATA: Mass left inguinal region. Recurrent UTI. Prior left inguinal hernia.  EXAM: SCROTAL ULTRASOUND  DOPPLER ULTRASOUND OF THE TESTICLES  TECHNIQUE: Complete ultrasound examination of the testicles, epididymis, and other scrotal structures was performed. Color and spectral Doppler ultrasound were also utilized to evaluate blood flow to the testicles.  COMPARISON: CT 08/09/2015 .  FINDINGS: Right testicle  Measurements: 2.3 x 1.0 x 1.6 cm. No mass or microlithiasis visualized.  Left testicle  Measurements: 2.8 x 1.4 x 1.9 cm. Two small cysts are noted in the left testicle, the largest measures 6 mm.  Right epididymis: Normal in size and appearance.  Left epididymis: Normal in size and appearance.  Hydrocele: None visualized.  Varicocele: None visualized.  Pulsed Doppler interrogation of both testes demonstrates normal low resistance arterial and venous waveforms bilaterally.  IMPRESSION: No significant abnormality.  Electronically Signed  By: Maisie Fus Register  On: 08/09/2015 11:51 EXAM: CT ABDOMEN AND PELVIS WITHOUT CONTRAST  TECHNIQUE: Multidetector CT imaging of the abdomen and pelvis was performed following the standard protocol without IV contrast.  COMPARISON: None.  FINDINGS: Mosaic appearance of aeration at the lung bases most likely represent small airways disease. No definite active infiltrate or effusion is seen. Three vessel coronary artery calcifications are noted. Hepatic calcifications are noted most consistent with prior granulomatous disease. There is some dependent debris within the gallbladder. This could represent gallbladder sludge versus noncalcified gallstones. No gallbladder wall thickening is seen. The pancreas is normal in size and the pancreatic duct is not dilated. The adrenal glands and spleen are unremarkable. The stomach is not well distended. No renal calculi  are seen and there is no evidence of hydronephrosis. A single peripheral calcification is noted anteriorly in the upper left kidney of doubtful significance. The abdominal aorta is normal in caliber with moderate atherosclerotic change present. No aneurysmal dilatation is seen.  The urinary bladder is not well distended but no intrinsic abnormality is seen. However, there is a small focus of calcification at the expected right UV junction most consistent with a nonobstructing right UV junction calculus of 2-3 mm. The distal left ureter is normal in caliber. The prostate is small. The appendix and terminal ileum are unremarkable. There are degenerative changes in both hips and degenerative disc disease is noted at L4-5 and L5-S1.  IMPRESSION: 1. Small calcific density at the expected right UV junction may represent a nonobstructing 2-3 mm right UV junction calculus. 2. No additional renal or ureteral calculi are seen. 3. 3 vessel coronary artery disease. 4. Suspect gallbladder sludge. No definite calcified gallstones. Electronically Signed  By: Dwyane Dee M.D.  On: 08/09/2015 11:08    Assessment & Plan:  1. Recurrent UTI-  UA showing 0-5 WBCs, 3-10 RBCs, and few bacteria today. Will send specimen for culture. If patient's UTIs continue to be recurrent we will consider a circumcision in the future. Good penile hygiene reviewed once again with patient. Possible small ureteral stone seen on recent CT scan. Status post very small and should pass on its own. There was no obstruction seen.  2. Microscopic hematuria-  3-10 RBCs seen on today's urinalysis. Urine was sent for culture. We will recheck his urine at follow-up visit.  Renal ultrasound and CT results reviewed with patient and his wife. Questions were answered.  There are no diagnoses linked to this encounter.  Return in about 3 months (around 11/20/2015), or if symptoms worsen or fail to improve.  These notes generated  with voice recognition software. I apologize for typographical errors.  Earlie Lou, FNP  [facility] [address] [place], [place] [address] [phone]

## 2015-08-24 ENCOUNTER — Telehealth: Payer: Self-pay

## 2015-08-24 LAB — CULTURE, URINE COMPREHENSIVE

## 2015-08-24 NOTE — Telephone Encounter (Signed)
Spoke with pt wife in reference to ucx. Wife stated pt does not seem to have any symptoms since coming off of abx. Nurse reinforced with wife to call back should pt develop any UTI like symptoms. Wife voiced understanding.

## 2015-08-24 NOTE — Telephone Encounter (Signed)
-----   Message from Fernanda Drum, FNP sent at 08/24/2015  2:17 PM EST ----- Please notify patient and his wife that his urine culture grew out only a small amount of bacteria with is most consistent with skin contamination. If he is having any urinary symptoms I would like for him to return for a catheterized specimen that we can send for culture. Otherwise I would like him to follow up as scheduled. Thanks

## 2015-09-12 ENCOUNTER — Encounter: Payer: Self-pay | Admitting: Physical Therapy

## 2015-09-12 ENCOUNTER — Ambulatory Visit: Payer: Medicare Other | Admitting: Physical Therapy

## 2015-09-12 ENCOUNTER — Encounter: Payer: Self-pay | Admitting: Speech Pathology

## 2015-09-12 ENCOUNTER — Ambulatory Visit: Payer: Medicare Other | Attending: Neurology | Admitting: Speech Pathology

## 2015-09-12 DIAGNOSIS — M6281 Muscle weakness (generalized): Secondary | ICD-10-CM | POA: Insufficient documentation

## 2015-09-12 DIAGNOSIS — R262 Difficulty in walking, not elsewhere classified: Secondary | ICD-10-CM | POA: Diagnosis present

## 2015-09-12 DIAGNOSIS — R490 Dysphonia: Secondary | ICD-10-CM | POA: Diagnosis not present

## 2015-09-12 DIAGNOSIS — R278 Other lack of coordination: Secondary | ICD-10-CM

## 2015-09-12 DIAGNOSIS — R208 Other disturbances of skin sensation: Secondary | ICD-10-CM | POA: Diagnosis present

## 2015-09-12 DIAGNOSIS — R279 Unspecified lack of coordination: Secondary | ICD-10-CM | POA: Insufficient documentation

## 2015-09-12 DIAGNOSIS — R200 Anesthesia of skin: Secondary | ICD-10-CM

## 2015-09-12 NOTE — Therapy (Signed)
[facility] MAIN [facility] SERVICES [address] [place], [place], [address] Phone: [phone]   Fax:  [phone]  Physical Therapy Evaluation  Patient Details  Name: Johnny Bowen MRN: [id number] Date of Birth: [birth year] Referring Provider: shah  Encounter Date: 09/12/2015      PT End of Session - 09/12/15 0932    Visit Number 1   Number of Visits 17   Date for PT Re-Evaluation 11-13-2015   Authorization Type g codes   PT Start Time 1000   PT Stop Time 1100   PT Time Calculation (min) 60 min   Equipment Utilized During Treatment Gait belt   Activity Tolerance Patient tolerated treatment well   Behavior During Therapy [facility] for tasks assessed/performed      Past Medical History  Diagnosis Date  . Allergic rhinitis   . Anemia   . Arthritis   . CAD (coronary artery disease)   . Diabetes mellitus with neurological manifestations (HCC)   . Episodic atrial fibrillation (HCC)   . HTN (hypertension)   . GERD (gastroesophageal reflux disease)   . Heart block   . History of lower GI bleeding   . HLD (hyperlipidemia)   . MI (myocardial infarction) (HCC)   . Proteinuria   . RLS (restless legs syndrome)   . Shingles     Past Surgical History  Procedure Laterality Date  . Hernia repair    . Pacemaker placement    . Colonoscopy    . Esophagogastroduodenoscopy      There were no vitals filed for this visit.  Visit Diagnosis:  Difficulty walking  Muscle weakness  Decreased coordination  Numbness in feet      Subjective Assessment - 09/12/15 1010    Subjective Patient has had a fall and has been diagonosed with parkinsons disease. Patient has restless leg syndrome, trouble with putting on shirt, buttons, in/out of the car, trouble sliding down the booth, carrying a cup of coffee, manuipulating his phone wallot out of his pocket, controling the mouse.   Patient is accompained by: Family member   Pertinent History neuropathy,  restless leg syndrome, ,Diabetes, high blood pressure, pace maker , a -fib, leg cramps, trouble with steps, rising from a chair,             [facility] PT Assessment - 09/12/15 0001    Assessment   Medical Diagnosis parkinsons   Referring Provider shah   Onset Date/Surgical Date 08/08/15   Next MD Visit Feb 1,17   Prior Therapy no   Precautions   Precautions Fall   Balance Screen   Has the patient fallen in the past 6 months Yes   How many times? 1   Has the patient had a decrease in activity level because of a fear of falling?  Yes   Is the patient reluctant to leave their home because of a fear of falling?  No   Home Environment   Living Environment Private residence   Living Arrangements Spouse/significant other   Available Help at Discharge Family   Type of Home House   Home Access Stairs to enter   Entrance Stairs-Number of Steps 4   Entrance Stairs-Rails Right   Home Layout Two level   Prior Function   Level of Independence Needs assistance with ADLs   Cognition   Overall Cognitive Status History of cognitive impairments - at baseline   Attention Focused   Memory Impaired      PAIN: no reports of  pain except leg cramps at night  POSTURE: flexed trunk standing  PROM/AROM: WFL BUE , BLE  STRENGTH:  Graded on a 0-5 scale Muscle Group Left Right  Shoulder flex 4 4  Shoulder abd 4 4  Shoulder Ext 4 4  Shoulder IR/ER 4 4  Elbow 4 4  Wrist/hand 4 4  Hip Flex 4 4      Hip Add 4 4  Hip Ext 3 3  Hip IR/ER 4 4  Knee Flex 4 4  Knee Ext 4 4  Ankle DF 4 4  Ankle PF 4 4   SENSATION: Numbness BLE feet     FUNCTIONAL MOBILITY: minimal deficits sit to stand with several attempts from low chair   BALANCE: unable to tandem stand,, unable to single leg stand   GAIT: Patient ambulates  with decreased arm swing, decreased step length, decreased gait speed  OUTCOME MEASURES: TEST Outcome Interpretation  5 times sit<>stand 15.91 secsec >60 yo, >15 sec indicates  increased risk for falls  10 meter walk test    1.17              m/s <1.0 m/s indicates increased risk for falls; limited community ambulator  Timed up and Go  11.87,  tug carry 12.70, tug cog 16.17               sec <14 sec indicates increased risk for falls  6 minute walk test    1100            Feet 1000 feet is community Financial controller  <36/56 (100% risk for falls), 37-45 (80% risk for falls); 46-51 (>50% risk for falls); 52-55 (lower risk <25% of falls)  9 Hole Peg Test L:  41.02              R:  52.97                           PT Education - 09/12/15 0931    Education provided Yes   Education Details saftey with ambulation   Person(s) Educated Patient   Methods Explanation   Comprehension Verbalized understanding             PT Long Term Goals - 09/12/15 0933    PT LONG TERM GOAL #1   Title Patient will be independent in home exercise program to improve strength/mobility for better functional independence with ADLs.   Time 4   Period Weeks   Status New   PT LONG TERM GOAL #2   Title Patient (< 72 years old) will complete five times sit to stand test in < 10 seconds indicating an increased LE strength and improved balance.   Time 4   Period Weeks   Status New   PT LONG TERM GOAL #3   Title Patient will increase six minute walk test distance to >1000 for progression to community ambulator and improve gait ability   Time 4   Period Weeks   Status New   PT LONG TERM GOAL #4   Title Patient will increase 10 meter walk test to >1.40m/s as to improve gait speed for better community ambulation and to reduce fall risk   Time 4   Period Weeks   PT LONG TERM GOAL #5   Title Patient will reduce timed up and go to <11 seconds to reduce fall risk and demonstrate improved transfer/gait ability   Time 4  Period Weeks               Plan - 10/04/15 1114    Clinical Impression Statement Patient has history of a fall, neuropathy,  DM, restless leg syndrome, A-fib, weakness, impaired balance that affect his gait and mobility. He has difficulty with getting on/off his shirts, buttons, holding drinks and walking, getting in and out of the car, performing sit to stand, walking intermediate and long distances, and coordination and fine motor control of hands and fingers.    Pt will benefit from skilled therapeutic intervention in order to improve on the following deficits Abnormal gait;Decreased strength;Increased muscle spasms;Postural dysfunction;Difficulty walking;Decreased activity tolerance;Impaired sensation;Decreased balance;Decreased cognition;Pain;Decreased coordination;Decreased endurance   Rehab Potential Good   PT Frequency 4x / week   PT Duration 4 weeks   PT Treatment/Interventions ADLs/Self Care Home Management;Stair training;Gait training;Therapeutic exercise;Balance training;Neuromuscular re-education;Therapeutic activities   Consulted and Agree with Plan of Care Patient;Family member/caregiver          G-Codes - 04-Oct-2015 0936    Functional Assessment Tool Used 5 x sit to stand, TUG, 10 MW, 6 MW, peg test   Functional Limitation Mobility: Walking and moving around   Mobility: Walking and Moving Around Current Status [phone]) At least 20 percent but less than 40 percent impaired, limited or restricted   Mobility: Walking and Moving Around Goal Status [phone]) At least 1 percent but less than 20 percent impaired, limited or restricted       Problem List Patient Active Problem List   Diagnosis Date Noted  . HB (heart block) 07/26/2015  . Myocardial infarction (HCC) 07/26/2015  . Parkinson's disease (HCC) 04/07/2014  . Absolute anemia 03/06/2014  . Paroxysmal atrial fibrillation (HCC) 03/06/2014  . Abnormal presence of protein in urine 03/06/2014  . Allergic rhinitis 03/04/2014  . Arteriosclerosis of coronary artery 03/04/2014  . Diabetes mellitus (HCC) 03/04/2014  . Benign essential HTN 03/04/2014  .  Acid reflux 03/04/2014  . HLD (hyperlipidemia) 03/04/2014  . Restless leg 03/04/2014  . Sensory neuropathy [facility]) 03/04/2014    [name] 10-04-15, 11:27 AM  [facility] MAIN [facility] SERVICES [address] [place], [place], [address] Phone: [phone]   Fax:  [phone]  Name: Johnny Bowen MRN: [id number] Date of Birth: Oct 20, 1929

## 2015-09-12 NOTE — Therapy (Signed)
[place] [facility] MAIN [facility] SERVICES [address] [place], [place], [address] Phone: [phone]   Fax:  [phone]  Speech Language Pathology Evaluation  Patient Details  Name: Johnny Bowen MRN: [id number] Date of Birth: Apr 13, 1930 Referring Provider: Dr. Sherryll Burger  Encounter Date: 09/12/2015      End of Session - 09/12/15 1353    Visit Number 1   Number of Visits 17   Date for SLP Re-Evaluation 10/19/15   SLP Start Time 0910   SLP Stop Time  0959   SLP Time Calculation (min) 49 min   Activity Tolerance Patient tolerated treatment well      Past Medical History  Diagnosis Date   Allergic rhinitis    Anemia    Arthritis    CAD (coronary artery disease)    Diabetes mellitus with neurological manifestations (HCC)    Episodic atrial fibrillation (HCC)    HTN (hypertension)    GERD (gastroesophageal reflux disease)    Heart block    History of lower GI bleeding    HLD (hyperlipidemia)    MI (myocardial infarction) (HCC)    Proteinuria    RLS (restless legs syndrome)    Shingles     Past Surgical History  Procedure Laterality Date   Hernia repair     Pacemaker placement     Colonoscopy     Esophagogastroduodenoscopy      There were no vitals filed for this visit.  Visit Diagnosis: Dysphonia - Plan: SLP plan of care cert/re-cert      Subjective Assessment - 09/12/15 1349    Subjective The patient reports that speech/voice changes due to Parkinson's; changes include imprecise articulation, hoarse vocal quality, monotone speech, and decreased facial expressions.  There are minimal reported swallowing problems.  Patient exhibits frequent throat clearing.   Patient is accompained by: Family member            SLP Evaluation [facility] - 09/12/15 1343    SLP Visit Information   SLP Received On 09/12/15   Referring Provider Dr. Sherryll Burger   Onset Date 05/24/2015   Medical Diagnosis Parkinson's disease   Subjective   Subjective The patient reports that speech/voice changes due to Parkinson's; changes include imprecise articulation, hoarse vocal quality, monotone speech, and decreased facial expressions.  There are minimal reported swallowing problems.  Patient exhibits frequent throat clearing.   Patient/Family Stated Goal Intelligible speech   Oral Motor/Sensory Function   Overall Oral Motor/Sensory Function Appears within functional limits for tasks assessed   Motor Speech   Overall Motor Speech Impaired   Respiration Impaired   Level of Impairment Conversation   Phonation Hoarse   Resonance Within functional limits   Articulation Impaired   Level of Impairment Conversation   Intelligibility Intelligibility reduced   Conversation 50-74% accurate   Motor Planning Witnin functional limits   Phonation Impaired   Vocal Abuses Habitual Cough/Throat Clear;Vocal Fold Dehydration;Prolonged Vocal Use;Glottal Attack   Volume Soft   Pitch --  Monotone   Standardized Assessments   Standardized Assessments  Other Assessment  LSVT-LOUD voice evaluation       LSVT-LOUD Voice Evaluation  Voice history: The patient reports that speech/voice changes due to Parkinsons; changes include imprecise articulation, hoarse vocal quality, monotone speech, and decreased facial expressions.  There are minimal reported swallowing problems.  Patient exhibits frequent throat clearing.  Maximum phonation time for sustained ah: 13 seconds  Mean intensity during sustained ah: 75 dB   Mean intensity sustained during conversational  speech: 70 dB  Average fundamental frequency during sustained ah: 145 Hz   Highest dynamic pitch when altering pitch from a low note to a high note: 591 Hz  Highest pitch during conversational speech: 372 Hz  Lowest dynamic pitch when altering from a high note to a low note: 98 Hz  Lowest pitch during conversational speech: 92 Hz  Visi-Pitch: Multi-Dimensional Voice Program (MDVP)   MDVP extracts objective quantitative values (Relative Average Perturbation, Shimmer, Voice Turbulence Index, and Noise to Harmonic Ratio) on sustained phonation, which are displayed graphically and numerically in comparison to a built-in normative database.  The patient exhibited values outside the norm for Shimmer and Voice Turbulence Index. The patient improved all parameters, as well as perceived vocal quality, when cued to alter voicing (loud like me).   Stimulability:  Patient demonstrates improved vocal quality and articulatory precision with loud voice.           SLP Education - 09/30/2015 1351    Education provided Yes   Education Details LSVT-LOUD protocol and benefits   Person(s) Educated Patient;Spouse   Methods Explanation   Comprehension Verbalized understanding            SLP Long Term Goals - 09-30-2015 1355    SLP LONG TERM GOAL #1   Title The patient will complete Daily Tasks (Maximum duration "ah", High/Lows, and Functional Phrases) at average loudness of 80 dB and with loud, good quality voice.    Time 4   Period Weeks   Status New   SLP LONG TERM GOAL #2   Title The patient will complete Hierarchal Speech Loudness reading drills (words/phrases, sentences, and paragraph) at average 75 dB and with loud, good quality voice.     Time 4   Period Weeks   Status New   SLP LONG TERM GOAL #3   Title The patient will complete homework daily.   Time 4   Period Weeks   Status New   SLP LONG TERM GOAL #4   Title The patient will participate in conversation, maintaining average loudness of 75 dB and loud, good quality voice.   Time 4   Period Weeks   Status New          Plan - 09/30/15 1354    Clinical Impression Statement This 82 year old man with diagnosed Parkinson' disease is presenting with moderate voice disorder characterized by hoarse vocal quality, imprecise articulation, monotone voice, and reduced facial expression.  Based on stimulability testing,  the patient is judged to be a good candidate for the LSVT LOUD program.  It is recommended that the patient receive the LSVT LOUD program which is comprised of 16 intensive sessions (4 times per week for 4 weeks, one hour sessions).  Prognosis for improvement is good based on his motivation, stimulability, and strong family support.  LSVT LOUD has been documented in the literature as efficacious for individuals with Parkinson's disease.     Speech Therapy Frequency 4x / week   Duration 4 weeks   Treatment/Interventions Other (comment)  LSVT-LOUD   Potential to Achieve Goals Good   Potential Considerations Ability to learn/carryover information;Cooperation/participation level;Medical prognosis;Previous level of function;Severity of impairments;Family/community support   SLP Home Exercise Plan LSVT-LOUD daily homework   Consulted and Agree with Plan of Care Patient;Family member/caregiver   Family Member Consulted Spouse          G-Codes - 09/30/15 1357    Functional Assessment Tool Used LSVT-LOUD voice evaluation, clinical judgment  Functional Limitations Voice   Voice Current Status [phone]) At least 40 percent but less than 60 percent impaired, limited or restricted   Voice Goal Status ([id number]) At least 1 percent but less than 20 percent impaired, limited or restricted      Problem List Patient Active Problem List   Diagnosis Date Noted   HB (heart block) 07/26/2015   Myocardial infarction (HCC) 07/26/2015   Parkinson's disease (HCC) 04/07/2014   Absolute anemia 03/06/2014   Paroxysmal atrial fibrillation (HCC) 03/06/2014   Abnormal presence of protein in urine 03/06/2014   Allergic rhinitis 03/04/2014   Arteriosclerosis of coronary artery 03/04/2014   Diabetes mellitus (HCC) 03/04/2014   Benign essential HTN 03/04/2014   Acid reflux 03/04/2014   HLD (hyperlipidemia) 03/04/2014   Restless leg 03/04/2014   Sensory neuropathy (HCC) 03/04/2014   Dollene Primrose,  MS/CCC- SLP  Leandrew Koyanagi 09/12/2015, 2:02 PM  [place] [facility] MAIN [facility] SERVICES [address] [place], [place], [address] Phone: [phone]   Fax:  [phone]  Name: KOEN KRESSIN MRN: [id number] Date of Birth: October 09, 1929

## 2015-09-17 ENCOUNTER — Ambulatory Visit: Payer: Medicare Other | Admitting: Physical Therapy

## 2015-09-17 ENCOUNTER — Ambulatory Visit: Payer: Medicare Other | Admitting: Speech Pathology

## 2015-09-18 ENCOUNTER — Ambulatory Visit: Payer: Medicare Other | Admitting: Physical Therapy

## 2015-09-18 ENCOUNTER — Ambulatory Visit: Payer: Medicare Other | Admitting: Speech Pathology

## 2015-09-19 ENCOUNTER — Encounter: Payer: Self-pay | Admitting: Physical Therapy

## 2015-09-19 ENCOUNTER — Ambulatory Visit: Payer: Medicare Other | Admitting: Speech Pathology

## 2015-09-19 ENCOUNTER — Ambulatory Visit: Payer: Medicare Other | Admitting: Physical Therapy

## 2015-09-19 DIAGNOSIS — R490 Dysphonia: Secondary | ICD-10-CM | POA: Diagnosis not present

## 2015-09-19 DIAGNOSIS — R279 Unspecified lack of coordination: Secondary | ICD-10-CM

## 2015-09-19 DIAGNOSIS — R262 Difficulty in walking, not elsewhere classified: Secondary | ICD-10-CM

## 2015-09-19 DIAGNOSIS — R278 Other lack of coordination: Secondary | ICD-10-CM

## 2015-09-19 DIAGNOSIS — M6281 Muscle weakness (generalized): Secondary | ICD-10-CM

## 2015-09-19 DIAGNOSIS — R200 Anesthesia of skin: Secondary | ICD-10-CM

## 2015-09-19 NOTE — Therapy (Signed)
[facility] MAIN [facility] SERVICES [address] [place], [place], [address] Phone: [phone]   Fax:  [phone]  Physical Therapy Treatment  Patient Details  Name: Johnny Bowen MRN: [id number] Date of Birth: 15-Oct-1929 Referring Provider: shah  Encounter Date: 09/19/2015      PT End of Session - 09/19/15 1118    Visit Number 2   Number of Visits 17   Date for PT Re-Evaluation November 02, 2015   Authorization Type g codes   PT Start Time 1000   PT Stop Time 1100   PT Time Calculation (min) 60 min   Equipment Utilized During Treatment Gait belt   Activity Tolerance Patient tolerated treatment well   Behavior During Therapy [facility] for tasks assessed/performed      Past Medical History  Diagnosis Date  . Allergic rhinitis   . Anemia   . Arthritis   . CAD (coronary artery disease)   . Diabetes mellitus with neurological manifestations (HCC)   . Episodic atrial fibrillation (HCC)   . HTN (hypertension)   . GERD (gastroesophageal reflux disease)   . Heart block   . History of lower GI bleeding   . HLD (hyperlipidemia)   . MI (myocardial infarction) (HCC)   . Proteinuria   . RLS (restless legs syndrome)   . Shingles     Past Surgical History  Procedure Laterality Date  . Hernia repair    . Pacemaker placement    . Colonoscopy    . Esophagogastroduodenoscopy      There were no vitals filed for this visit.  Visit Diagnosis:  Difficulty walking  Muscle weakness  Decreased coordination  Numbness in feet      Subjective Assessment - 09/19/15 1115    Subjective Patient is ready to begin the LSVT BIG program today.    Patient is accompained by: Family member   Pertinent History neuropathy, restless leg syndrome, ,Diabetes, high blood pressure, pace maker , a -fib, leg cramps, trouble with steps, rising from a chair,    Currently in Pain? No/denies      Floor to ceiling x 10 reps, side to side 10 reps, step and reach forward x  10 reps, step and reach backwards x 10, step and reach sideways x 10 , Rock and reach forward/backward x 10 , Rock and reach sideways x 10, functional tasks; 1 sit to stand 2. Coordination hand tasks. Walking BIG with UE's swing Patient has unsteady stepping with several exercises but his motor control improve with practice. Patient needs occasional verbal cueing to improve posture and cueing to correctly perform exercises slowly, holding at end of range to increase motor firing of desired muscle to encourage fatigue                            PT Education - 09/19/15 1117    Education provided Yes   Education Details LSVT BIG   Person(s) Educated Patient   Methods Explanation   Comprehension Verbalized understanding             PT Long Term Goals - 09/12/15 0933    PT LONG TERM GOAL #1   Title Patient will be independent in home exercise program to improve strength/mobility for better functional independence with ADLs.   Time 4   Period Weeks   Status New   PT LONG TERM GOAL #2   Title Patient (< 2 years old) will complete five times sit  to stand test in < 10 seconds indicating an increased LE strength and improved balance.   Time 4   Period Weeks   Status New   PT LONG TERM GOAL #3   Title Patient will increase six minute walk test distance to >1000 for progression to community ambulator and improve gait ability   Time 4   Period Weeks   Status New   PT LONG TERM GOAL #4   Title Patient will increase 10 meter walk test to >1.44m/s as to improve gait speed for better community ambulation and to reduce fall risk   Time 4   Period Weeks   PT LONG TERM GOAL #5   Title Patient will reduce timed up and go to <11 seconds to reduce fall risk and demonstrate improved transfer/gait ability   Time 4   Period Weeks               Plan - 09/19/15 1119    Clinical Impression Statement CGA and Min to mod verbal cues used throughout with increased in  postural sway and LOB most seen with narrow base of support and while on uneven surfaces. Continues to have balance deficits typical with diagnosis. Patient performs intermediate level exercises without pain behaviors and needs verbal cuing for postural alignment and head positioning Tactile cues and assistance needed to keep lower leg and knee in neutral to avoid compensations with ankle motions   Pt will benefit from skilled therapeutic intervention in order to improve on the following deficits Abnormal gait;Decreased strength;Increased muscle spasms;Postural dysfunction;Difficulty walking;Decreased activity tolerance;Impaired sensation;Decreased balance;Decreased cognition;Pain;Decreased coordination;Decreased endurance   Rehab Potential Good   Clinical Impairments Affecting Rehab Potential left shoulder pain with certian positions   PT Frequency 4x / week   PT Duration 4 weeks   PT Treatment/Interventions ADLs/Self Care Home Management;Stair training;Gait training;Therapeutic exercise;Balance training;Neuromuscular re-education;Therapeutic activities   Consulted and Agree with Plan of Care Patient;Family member/caregiver        Problem List Patient Active Problem List   Diagnosis Date Noted  . HB (heart block) 07/26/2015  . Myocardial infarction (HCC) 07/26/2015  . Parkinson's disease (HCC) 04/07/2014  . Absolute anemia 03/06/2014  . Paroxysmal atrial fibrillation (HCC) 03/06/2014  . Abnormal presence of protein in urine 03/06/2014  . Allergic rhinitis 03/04/2014  . Arteriosclerosis of coronary artery 03/04/2014  . Diabetes mellitus (HCC) 03/04/2014  . Benign essential HTN 03/04/2014  . Acid reflux 03/04/2014  . HLD (hyperlipidemia) 03/04/2014  . Restless leg 03/04/2014  . Sensory neuropathy (HCC) 03/04/2014   [name], PT, DPT Winder S 09/19/2015, 11:26 AM  [place] [facility] MAIN [facility] SERVICES [address]  [place], [place], [address] Phone: [phone]   Fax:  [phone]  Name: ELSA WEDELL MRN: [id number] Date of Birth: [birth year]

## 2015-09-20 ENCOUNTER — Encounter: Payer: Self-pay | Admitting: Speech Pathology

## 2015-09-20 ENCOUNTER — Ambulatory Visit: Payer: Medicare Other | Admitting: Physical Therapy

## 2015-09-20 ENCOUNTER — Ambulatory Visit: Payer: Medicare Other | Admitting: Speech Pathology

## 2015-09-20 ENCOUNTER — Encounter: Payer: Self-pay | Admitting: Physical Therapy

## 2015-09-20 DIAGNOSIS — R490 Dysphonia: Secondary | ICD-10-CM

## 2015-09-20 DIAGNOSIS — R278 Other lack of coordination: Secondary | ICD-10-CM

## 2015-09-20 DIAGNOSIS — R279 Unspecified lack of coordination: Secondary | ICD-10-CM

## 2015-09-20 DIAGNOSIS — R262 Difficulty in walking, not elsewhere classified: Secondary | ICD-10-CM

## 2015-09-20 DIAGNOSIS — M6281 Muscle weakness (generalized): Secondary | ICD-10-CM

## 2015-09-20 DIAGNOSIS — R200 Anesthesia of skin: Secondary | ICD-10-CM

## 2015-09-20 NOTE — Therapy (Signed)
[place] [facility] MAIN [facility] SERVICES [address] [place], [place], [address] Phone: [phone]   Fax:  [phone]  Speech Language Pathology Treatment  Patient Details  Name: Johnny Bowen MRN: [id number] Date of Birth: Feb 03, 1930 Referring Provider: Dr. Sherryll Burger  Encounter Date: 09/19/2015      End of Session - 09/20/15 0811    Visit Number 2   Number of Visits 17   Date for SLP Re-Evaluation 10/19/15   SLP Start Time 0915   SLP Stop Time  1000   SLP Time Calculation (min) 45 min   Activity Tolerance Patient tolerated treatment well      Past Medical History  Diagnosis Date  . Allergic rhinitis   . Anemia   . Arthritis   . CAD (coronary artery disease)   . Diabetes mellitus with neurological manifestations (HCC)   . Episodic atrial fibrillation (HCC)   . HTN (hypertension)   . GERD (gastroesophageal reflux disease)   . Heart block   . History of lower GI bleeding   . HLD (hyperlipidemia)   . MI (myocardial infarction) (HCC)   . Proteinuria   . RLS (restless legs syndrome)   . Shingles     Past Surgical History  Procedure Laterality Date  . Hernia repair    . Pacemaker placement    . Colonoscopy    . Esophagogastroduodenoscopy      There were no vitals filed for this visit.  Visit Diagnosis: Dysphonia      Subjective Assessment - 09/20/15 0810    Subjective The patient's wife is concerned about the rate and precision of the patient's speech   Patient is accompained by: Family member   Currently in Pain? No/denies               ADULT SLP TREATMENT - 09/20/15 0001    General Information   Behavior/Cognition Alert;Cooperative;Pleasant mood;Hard of hearing   HPI Parkinson's disease   Treatment Provided   Treatment provided Cognitive-Linquistic   Pain Assessment   Pain Assessment No/denies pain   Cognitive-Linquistic Treatment   Treatment focused on Voice   Skilled Treatment Daily Task #1: Average 10  seconds, 85 dB. Daily Task 2: Highs: 15 high pitched "ah" given mod-max cues. Lows: 15 low pitched "ah" given mod-max cues. Daily task #3: Average 75 dB.  Hierarchal speech loudness drill: Read word, 73 dB (needs cues for accurate articulation).  Homework: assignments given.  Off the cuff remarks: average 68 dB.   Assessment / Recommendations / Plan   Plan Continue with current plan of care   Progression Toward Goals   Progression toward goals Progressing toward goals          SLP Education - 09/20/15 0811    Education provided Yes   Education Details LSVT-LOUD   Person(s) Educated Patient   Methods Explanation;Demonstration;Verbal cues;Handout   Comprehension Verbalized understanding;Returned demonstration;Verbal cues required;Need further instruction            SLP Long Term Goals - 09/12/15 1355    SLP LONG TERM GOAL #1   Title The patient will complete Daily Tasks (Maximum duration "ah", High/Lows, and Functional Phrases) at average loudness of 80 dB and with loud, good quality voice.    Time 4   Period Weeks   Status New   SLP LONG TERM GOAL #2   Title The patient will complete Hierarchal Speech Loudness reading drills (words/phrases, sentences, and paragraph) at average 75 dB and with loud, good quality  voice.     Time 4   Period Weeks   Status New   SLP LONG TERM GOAL #3   Title The patient will complete homework daily.   Time 4   Period Weeks   Status New   SLP LONG TERM GOAL #4   Title The patient will participate in conversation, maintaining average loudness of 75 dB and loud, good quality voice.   Time 4   Period Weeks   Status New          Plan - 09/20/15 4098    Clinical Impression Statement The patient is completing daily tasks and hierarchal speech drill tasks with loud, good quality voice given SLP cues for quality and articulatory precision.     Speech Therapy Frequency 4x / week   Duration 4 weeks   Treatment/Interventions Other (comment)   LSVT-LOUD   Potential Considerations Ability to learn/carryover information;Cooperation/participation level;Medical prognosis;Previous level of function;Severity of impairments;Family/community support   SLP Home Exercise Plan LSVT-LOUD daily homework   Consulted and Agree with Plan of Care Patient;Family member/caregiver   Family Member Consulted Spouse        Problem List Patient Active Problem List   Diagnosis Date Noted  . HB (heart block) 07/26/2015  . Myocardial infarction (HCC) 07/26/2015  . Parkinson's disease (HCC) 04/07/2014  . Absolute anemia 03/06/2014  . Paroxysmal atrial fibrillation (HCC) 03/06/2014  . Abnormal presence of protein in urine 03/06/2014  . Allergic rhinitis 03/04/2014  . Arteriosclerosis of coronary artery 03/04/2014  . Diabetes mellitus (HCC) 03/04/2014  . Benign essential HTN 03/04/2014  . Acid reflux 03/04/2014  . HLD (hyperlipidemia) 03/04/2014  . Restless leg 03/04/2014  . Sensory neuropathy (HCC) 03/04/2014   Dollene Primrose, MS/CCC- SLP  Leandrew Koyanagi 09/20/2015, 8:13 AM  [place] [facility] MAIN [facility] SERVICES [address] [place], [place], [address] Phone: [phone]   Fax:  [phone]   Name: VALERIA BENEZRA MRN: [id number] Date of Birth: 1929/11/14

## 2015-09-20 NOTE — Therapy (Signed)
[place] [facility] MAIN [facility] SERVICES [address] [place], [place], [address] Phone: [phone]   Fax:  [phone]  Speech Language Pathology Treatment  Patient Details  Name: Johnny Bowen MRN: [id number] Date of Birth: [birth year] Referring Provider: Dr. Sherryll Burger  Encounter Date: 09/20/2015      End of Session - 09/20/15 1317    Visit Number 3   Number of Visits 17   Date for SLP Re-Evaluation 10/19/15   SLP Start Time 0900   SLP Stop Time  0952   SLP Time Calculation (min) 52 min   Activity Tolerance Patient tolerated treatment well      Past Medical History  Diagnosis Date  . Allergic rhinitis   . Anemia   . Arthritis   . CAD (coronary artery disease)   . Diabetes mellitus with neurological manifestations (HCC)   . Episodic atrial fibrillation (HCC)   . HTN (hypertension)   . GERD (gastroesophageal reflux disease)   . Heart block   . History of lower GI bleeding   . HLD (hyperlipidemia)   . MI (myocardial infarction) (HCC)   . Proteinuria   . RLS (restless legs syndrome)   . Shingles     Past Surgical History  Procedure Laterality Date  . Hernia repair    . Pacemaker placement    . Colonoscopy    . Esophagogastroduodenoscopy      There were no vitals filed for this visit.  Visit Diagnosis: Dysphonia      Subjective Assessment - 09/20/15 1315    Subjective The patient initially stated he was fatigued from yesterday, but by the end of the session was particpating well.   Patient is accompained by: Family member   Currently in Pain? No/denies               ADULT SLP TREATMENT - 09/20/15 1315    General Information   Behavior/Cognition Alert;Cooperative;Pleasant mood;Hard of hearing   HPI Parkinson's disease   Treatment Provided   Treatment provided Cognitive-Linquistic   Pain Assessment   Pain Assessment No/denies pain   Cognitive-Linquistic Treatment   Treatment focused on Voice   Skilled Treatment  Daily Task #1: Average 12 seconds, 85 dB. Daily Task 2: Highs: 15 high pitched "ah" given mod cues. Lows: 15 low pitched "ah" given mod cues. Daily task #3: Average 75 dB.  Hierarchal speech loudness drill: Read words, 73 dB (needs fewer cues for accurate articulation).  Read sentences, 73 dB (requires fewer cues to slow rate and articulate clearly).  Generate short responses, 73 dB with min SLP cues.  Homework: assignments given.  Off the cuff remarks: average 68 dB.   Assessment / Recommendations / Plan   Plan Continue with current plan of care   Progression Toward Goals   Progression toward goals Progressing toward goals          SLP Education - 09/20/15 1316    Education provided Yes   Education Details LSVT-LOUD   Person(s) Educated Patient   Methods Explanation;Demonstration;Verbal cues;Handout   Comprehension Verbalized understanding;Returned demonstration;Verbal cues required;Need further instruction            SLP Long Term Goals - 09/12/15 1355    SLP LONG TERM GOAL #1   Title The patient will complete Daily Tasks (Maximum duration "ah", High/Lows, and Functional Phrases) at average loudness of 80 dB and with loud, good quality voice.    Time 4   Period Weeks   Status New  SLP LONG TERM GOAL #2   Title The patient will complete Hierarchal Speech Loudness reading drills (words/phrases, sentences, and paragraph) at average 75 dB and with loud, good quality voice.     Time 4   Period Weeks   Status New   SLP LONG TERM GOAL #3   Title The patient will complete homework daily.   Time 4   Period Weeks   Status New   SLP LONG TERM GOAL #4   Title The patient will participate in conversation, maintaining average loudness of 75 dB and loud, good quality voice.   Time 4   Period Weeks   Status New          Plan - 09/20/15 1317    Clinical Impression Statement The patient is completing daily tasks and hierarchal speech drill tasks with loud, good quality voice given  fewer SLP cues for quality and articulatory precision.     Speech Therapy Frequency 4x / week   Duration 4 weeks   Treatment/Interventions --  LSVT-LOUD   Potential to Achieve Goals Good   Potential Considerations Ability to learn/carryover information;Cooperation/participation level;Medical prognosis;Previous level of function;Severity of impairments;Family/community support   SLP Home Exercise Plan LSVT-LOUD daily homework   Consulted and Agree with Plan of Care Patient;Family member/caregiver   Family Member Consulted Spouse        Problem List Patient Active Problem List   Diagnosis Date Noted  . HB (heart block) 07/26/2015  . Myocardial infarction (HCC) 07/26/2015  . Parkinson's disease (HCC) 04/07/2014  . Absolute anemia 03/06/2014  . Paroxysmal atrial fibrillation (HCC) 03/06/2014  . Abnormal presence of protein in urine 03/06/2014  . Allergic rhinitis 03/04/2014  . Arteriosclerosis of coronary artery 03/04/2014  . Diabetes mellitus (HCC) 03/04/2014  . Benign essential HTN 03/04/2014  . Acid reflux 03/04/2014  . HLD (hyperlipidemia) 03/04/2014  . Restless leg 03/04/2014  . Sensory neuropathy (HCC) 03/04/2014   Dollene Primrose, MS/CCC- SLP  Leandrew Koyanagi 09/20/2015, 1:18 PM  [place] [facility] MAIN [facility] SERVICES [address] [place], [place], [address] Phone: [phone]   Fax:  [phone]   Name: Johnny Bowen MRN: [id number] Date of Birth: December 02, 1929

## 2015-09-20 NOTE — Therapy (Signed)
[place] [facility] MAIN [facility] SERVICES [address] [place], [place], [address] Phone: [phone]   Fax:  [phone]  Physical Therapy Treatment  Patient Details  Name: Johnny Bowen MRN: [id number] Date of Birth: 1930-05-24 Referring Provider: shah  Encounter Date: 09/20/2015      PT End of Session - 09/20/15 1029    Visit Number 3   Number of Visits 17   Date for PT Re-Evaluation 10-Nov-2015   Authorization Type g codes   PT Start Time 1000   PT Stop Time 1100   PT Time Calculation (min) 60 min   Equipment Utilized During Treatment Gait belt   Activity Tolerance Patient tolerated treatment well   Behavior During Therapy [facility] for tasks assessed/performed      Past Medical History  Diagnosis Date  . Allergic rhinitis   . Anemia   . Arthritis   . CAD (coronary artery disease)   . Diabetes mellitus with neurological manifestations (HCC)   . Episodic atrial fibrillation (HCC)   . HTN (hypertension)   . GERD (gastroesophageal reflux disease)   . Heart block   . History of lower GI bleeding   . HLD (hyperlipidemia)   . MI (myocardial infarction) (HCC)   . Proteinuria   . RLS (restless legs syndrome)   . Shingles     Past Surgical History  Procedure Laterality Date  . Hernia repair    . Pacemaker placement    . Colonoscopy    . Esophagogastroduodenoscopy      There were no vitals filed for this visit.  Visit Diagnosis:  Difficulty walking  Muscle weakness  Decreased coordination  Numbness in feet      Subjective Assessment - 09/20/15 1020    Subjective Patient is thinking that yesterday was too much for him and he wants to slow down the pace of the exercises.    Patient is accompained by: Family member   Pertinent History neuropathy, restless leg syndrome, ,Diabetes, high blood pressure, pace maker , a -fib, leg cramps, trouble with steps, rising from a chair,       Floor to ceiling x 10 reps, side to side 10 reps,  step and reach forward x 10 reps, step and reach backwards x 10, step and reach sideways x 10 , Rock and reach forward/backward x 10 , Rock and reach sideways x 10, functional tasks; 1 sit to stand 2. Coordination hand tasks and fine motor skills practice with coins, card shuffling. . Walking BIG with UE's swing Patient has unsteady stepping with several exercises but his motor control improve with practice. Patient needs occasional verbal cueing to improve posture and cueing to correctly perform exercises slowly, holding at end of range to increase motor firing of desired muscle to encourage fatigue                                     PT Education - 09/20/15 1021    Education provided Yes   Education Details LSVT BIG   Person(s) Educated Patient   Methods Explanation   Comprehension Verbalized understanding             PT Long Term Goals - 09/12/15 0933    PT LONG TERM GOAL #1   Title Patient will be independent in home exercise program to improve strength/mobility for better functional independence with ADLs.   Time 4   Period Weeks  Status New   PT LONG TERM GOAL #2   Title Patient (< 22 years old) will complete five times sit to stand test in < 10 seconds indicating an increased LE strength and improved balance.   Time 4   Period Weeks   Status New   PT LONG TERM GOAL #3   Title Patient will increase six minute walk test distance to >1000 for progression to community ambulator and improve gait ability   Time 4   Period Weeks   Status New   PT LONG TERM GOAL #4   Title Patient will increase 10 meter walk test to >1.26m/s as to improve gait speed for better community ambulation and to reduce fall risk   Time 4   Period Weeks   PT LONG TERM GOAL #5   Title Patient will reduce timed up and go to <11 seconds to reduce fall risk and demonstrate improved transfer/gait ability   Time 4   Period Weeks               Plan - 09/20/15 1030     Clinical Impression Statement Patient has decreased motor contriol with fine motor skills and with UE and LE during stepping pattern and with standing exercises.  pt especially has difficulty with opening his hands up towards the ceiling with multiple exercises and with extending his leg during seated side to side multi-directional reaching activity   Pt will benefit from skilled therapeutic intervention in order to improve on the following deficits Abnormal gait;Decreased strength;Increased muscle spasms;Postural dysfunction;Difficulty walking;Decreased activity tolerance;Impaired sensation;Decreased balance;Decreased cognition;Pain;Decreased coordination;Decreased endurance   Rehab Potential Good   Clinical Impairments Affecting Rehab Potential left shoulder pain with certian positions   PT Frequency 4x / week   PT Duration 4 weeks   PT Treatment/Interventions ADLs/Self Care Home Management;Stair training;Gait training;Therapeutic exercise;Balance training;Neuromuscular re-education;Therapeutic activities   Consulted and Agree with Plan of Care Patient;Family member/caregiver        Problem List Patient Active Problem List   Diagnosis Date Noted  . HB (heart block) 07/26/2015  . Myocardial infarction (HCC) 07/26/2015  . Parkinson's disease (HCC) 04/07/2014  . Absolute anemia 03/06/2014  . Paroxysmal atrial fibrillation (HCC) 03/06/2014  . Abnormal presence of protein in urine 03/06/2014  . Allergic rhinitis 03/04/2014  . Arteriosclerosis of coronary artery 03/04/2014  . Diabetes mellitus (HCC) 03/04/2014  . Benign essential HTN 03/04/2014  . Acid reflux 03/04/2014  . HLD (hyperlipidemia) 03/04/2014  . Restless leg 03/04/2014  . Sensory neuropathy [facility]) 03/04/2014    Johnny Bowen 09/20/2015, 10:43 AM  [place] [facility] MAIN [facility] SERVICES [address] [place], [place], [address] Phone: [phone]   Fax:  [phone]  Name: Johnny Bowen MRN: [id number] Date of Birth: 04-Jul-1930

## 2015-09-21 ENCOUNTER — Encounter: Payer: Self-pay | Admitting: Physical Therapy

## 2015-09-21 ENCOUNTER — Ambulatory Visit: Payer: Medicare Other | Admitting: Physical Therapy

## 2015-09-21 DIAGNOSIS — R262 Difficulty in walking, not elsewhere classified: Secondary | ICD-10-CM

## 2015-09-21 DIAGNOSIS — M6281 Muscle weakness (generalized): Secondary | ICD-10-CM

## 2015-09-21 DIAGNOSIS — R278 Other lack of coordination: Secondary | ICD-10-CM

## 2015-09-21 DIAGNOSIS — R490 Dysphonia: Secondary | ICD-10-CM | POA: Diagnosis not present

## 2015-09-21 DIAGNOSIS — R200 Anesthesia of skin: Secondary | ICD-10-CM

## 2015-09-21 DIAGNOSIS — R279 Unspecified lack of coordination: Secondary | ICD-10-CM

## 2015-09-21 NOTE — Therapy (Signed)
[place] [facility] MAIN [facility] SERVICES [address] [place], [place], [address] Phone: [phone]   Fax:  [phone]  Physical Therapy Treatment  Patient Details  Name: SHIKHAR PLUCINSKI MRN: [id number] Date of Birth: Aug 08, 1930 Referring Provider: shah  Encounter Date: 09/21/2015      PT End of Session - 09/21/15 1020    Visit Number 4   Number of Visits 17   Date for PT Re-Evaluation 2015-10-22   Authorization Type g codes   PT Start Time 1000   PT Stop Time 1100   PT Time Calculation (min) 60 min      Past Medical History  Diagnosis Date  . Allergic rhinitis   . Anemia   . Arthritis   . CAD (coronary artery disease)   . Diabetes mellitus with neurological manifestations (HCC)   . Episodic atrial fibrillation (HCC)   . HTN (hypertension)   . GERD (gastroesophageal reflux disease)   . Heart block   . History of lower GI bleeding   . HLD (hyperlipidemia)   . MI (myocardial infarction) (HCC)   . Proteinuria   . RLS (restless legs syndrome)   . Shingles     Past Surgical History  Procedure Laterality Date  . Hernia repair    . Pacemaker placement    . Colonoscopy    . Esophagogastroduodenoscopy      There were no vitals filed for this visit.  Visit Diagnosis:  Difficulty walking  Muscle weakness  Decreased coordination  Numbness in feet      Subjective Assessment - 09/21/15 1010    Subjective Patient is doing his exercises.    Patient is accompained by: Family member   Pertinent History neuropathy, restless leg syndrome, ,Diabetes, high blood pressure, pace maker , a -fib, leg cramps, trouble with steps, rising from a chair,       Floor to ceiling x 10 reps, side to side 10 reps, step and reach forward x 10 reps, step and reach backwards x 10, step and reach sideways x 10 , Rock and reach forward/backward x 10 , Rock and reach sideways x 10, functional tasks; 1 sit to stand 2. Coordination hand tasks and fine motor  skills practice with coins, card shuffling. . Walking BIG with UE's swing Patient has unsteady stepping with several exercises but his motor control improve with practice. Patient needs occasional verbal cueing to improve posture and cueing to correctly perform exercises slowly, holding at end of range to increase motor firing of desired muscle to encourage fatigue                           PT Education - 09/21/15 1012    Education provided Yes   Education Details LSVT BIG   Person(s) Educated Patient   Methods Explanation   Comprehension Verbalized understanding             PT Long Term Goals - 09/12/15 0933    PT LONG TERM GOAL #1   Title Patient will be independent in home exercise program to improve strength/mobility for better functional independence with ADLs.   Time 4   Period Weeks   Status New   PT LONG TERM GOAL #2   Title Patient (< 58 years old) will complete five times sit to stand test in < 10 seconds indicating an increased LE strength and improved balance.   Time 4   Period Weeks   Status New  PT LONG TERM GOAL #3   Title Patient will increase six minute walk test distance to >1000 for progression to community ambulator and improve gait ability   Time 4   Period Weeks   Status New   PT LONG TERM GOAL #4   Title Patient will increase 10 meter walk test to >1.63m/s as to improve gait speed for better community ambulation and to reduce fall risk   Time 4   Period Weeks   PT LONG TERM GOAL #5   Title Patient will reduce timed up and go to <11 seconds to reduce fall risk and demonstrate improved transfer/gait ability   Time 4   Period Weeks               Plan - 09/21/15 1027    Clinical Impression Statement Continues to have balance deficits typical with diagnosis. Patient performs intermediate level exercises without pain behaviors and needs verbal cuing for postural alignment and head positioning.   Pt will benefit from skilled  therapeutic intervention in order to improve on the following deficits Abnormal gait;Decreased strength;Increased muscle spasms;Postural dysfunction;Difficulty walking;Decreased activity tolerance;Impaired sensation;Decreased balance;Decreased cognition;Pain;Decreased coordination;Decreased endurance   Rehab Potential Good   Clinical Impairments Affecting Rehab Potential left shoulder pain with certian positions   PT Frequency 4x / week   PT Duration 4 weeks   PT Treatment/Interventions ADLs/Self Care Home Management;Stair training;Gait training;Therapeutic exercise;Balance training;Neuromuscular re-education;Therapeutic activities   Consulted and Agree with Plan of Care Patient;Family member/caregiver        Problem List Patient Active Problem List   Diagnosis Date Noted  . HB (heart block) 07/26/2015  . Myocardial infarction (HCC) 07/26/2015  . Parkinson's disease (HCC) 04/07/2014  . Absolute anemia 03/06/2014  . Paroxysmal atrial fibrillation (HCC) 03/06/2014  . Abnormal presence of protein in urine 03/06/2014  . Allergic rhinitis 03/04/2014  . Arteriosclerosis of coronary artery 03/04/2014  . Diabetes mellitus (HCC) 03/04/2014  . Benign essential HTN 03/04/2014  . Acid reflux 03/04/2014  . HLD (hyperlipidemia) 03/04/2014  . Restless leg 03/04/2014  . Sensory neuropathy [facility]) 03/04/2014    [name] 09/21/2015, 10:37 AM  [place] [facility] MAIN [facility] SERVICES [address] [place], [place], [address] Phone: [phone]   Fax:  [phone]  Name: KOBY PICKUP MRN: [id number] Date of Birth: 06-08-1930

## 2015-09-24 ENCOUNTER — Ambulatory Visit: Payer: Medicare Other | Admitting: Speech Pathology

## 2015-09-24 ENCOUNTER — Encounter: Payer: Self-pay | Admitting: Physical Therapy

## 2015-09-24 ENCOUNTER — Ambulatory Visit: Payer: Medicare Other | Admitting: Physical Therapy

## 2015-09-24 ENCOUNTER — Encounter: Payer: Self-pay | Admitting: Speech Pathology

## 2015-09-24 DIAGNOSIS — R278 Other lack of coordination: Secondary | ICD-10-CM

## 2015-09-24 DIAGNOSIS — R262 Difficulty in walking, not elsewhere classified: Secondary | ICD-10-CM

## 2015-09-24 DIAGNOSIS — R490 Dysphonia: Secondary | ICD-10-CM

## 2015-09-24 DIAGNOSIS — R200 Anesthesia of skin: Secondary | ICD-10-CM

## 2015-09-24 DIAGNOSIS — R279 Unspecified lack of coordination: Secondary | ICD-10-CM

## 2015-09-24 DIAGNOSIS — M6281 Muscle weakness (generalized): Secondary | ICD-10-CM

## 2015-09-24 NOTE — Therapy (Signed)
[place] MAIN [facility] SERVICES [address] [place], [place], [address] Phone: [phone]   Fax:  [phone]  Speech Language Pathology Treatment  Patient Details  Name: Johnny Bowen MRN: [id number] Date of Birth: 06-11-1930 Referring Provider: Dr. Sherryll Burger  Encounter Date: 09/24/2015      End of Session - 09/24/15 0956    Visit Number 4   Number of Visits 17   Date for SLP Re-Evaluation 10/19/15   SLP Start Time 0900   SLP Stop Time  0956   SLP Time Calculation (min) 56 min   Activity Tolerance Patient tolerated treatment well      Past Medical History  Diagnosis Date  . Allergic rhinitis   . Anemia   . Arthritis   . CAD (coronary artery disease)   . Diabetes mellitus with neurological manifestations (HCC)   . Episodic atrial fibrillation (HCC)   . HTN (hypertension)   . GERD (gastroesophageal reflux disease)   . Heart block   . History of lower GI bleeding   . HLD (hyperlipidemia)   . MI (myocardial infarction) (HCC)   . Proteinuria   . RLS (restless legs syndrome)   . Shingles     Past Surgical History  Procedure Laterality Date  . Hernia repair    . Pacemaker placement    . Colonoscopy    . Esophagogastroduodenoscopy      There were no vitals filed for this visit.  Visit Diagnosis: Dysphonia      Subjective Assessment - 09/24/15 0955    Subjective The patient appears to be pleased with his voice               ADULT SLP TREATMENT - 09/24/15 0001    General Information   Behavior/Cognition Alert;Cooperative;Pleasant mood;Hard of hearing   HPI Parkinson's disease   Treatment Provided   Treatment provided Cognitive-Linquistic   Pain Assessment   Pain Assessment No/denies pain   Cognitive-Linquistic Treatment   Treatment focused on Voice   Skilled Treatment Daily Task #1: Average 15 seconds, 85 dB. Daily Task 2: Highs: 15 high pitched "ah" given mod cues. Lows: 15 low pitched "ah" given min-mod  cues. Daily task #3: Average 75 dB.  Hierarchal speech loudness drill: Read sentences, 73 dB (requires fewer cues to slow rate and articulate clearly).  Generate short responses, 73 dB with min SLP cues.  Homework: assignments given.  Off the cuff remarks: average 68 dB.   Assessment / Recommendations / Plan   Plan Continue with current plan of care   Progression Toward Goals   Progression toward goals Progressing toward goals          SLP Education - 09/24/15 0955    Education provided Yes   Education Details LSVT-LOUD   Person(s) Educated Patient   Methods Explanation;Demonstration;Verbal cues;Handout   Comprehension Verbalized understanding;Returned demonstration;Verbal cues required;Need further instruction            SLP Long Term Goals - 09/12/15 1355    SLP LONG TERM GOAL #1   Title The patient will complete Daily Tasks (Maximum duration "ah", High/Lows, and Functional Phrases) at average loudness of 80 dB and with loud, good quality voice.    Time 4   Period Weeks   Status New   SLP LONG TERM GOAL #2   Title The patient will complete Hierarchal Speech Loudness reading drills (words/phrases, sentences, and paragraph) at average 75 dB and with loud, good quality voice.  Time 4   Period Weeks   Status New   SLP LONG TERM GOAL #3   Title The patient will complete homework daily.   Time 4   Period Weeks   Status New   SLP LONG TERM GOAL #4   Title The patient will participate in conversation, maintaining average loudness of 75 dB and loud, good quality voice.   Time 4   Period Weeks   Status New          Plan - 09/24/15 0956    Clinical Impression Statement The patient is completing daily tasks and hierarchal speech drill tasks with loud, good quality voice given fewer SLP cues for quality and articulatory precision.     Speech Therapy Frequency 4x / week   Duration 4 weeks   Treatment/Interventions Other (comment)  LSVT-LOUD   Potential to Achieve Goals  Good   Potential Considerations Ability to learn/carryover information;Cooperation/participation level;Medical prognosis;Previous level of function;Severity of impairments;Family/community support   SLP Home Exercise Plan LSVT-LOUD daily homework   Consulted and Agree with Plan of Care Patient        Problem List Patient Active Problem List   Diagnosis Date Noted  . HB (heart block) 07/26/2015  . Myocardial infarction (HCC) 07/26/2015  . Parkinson's disease (HCC) 04/07/2014  . Absolute anemia 03/06/2014  . Paroxysmal atrial fibrillation (HCC) 03/06/2014  . Abnormal presence of protein in urine 03/06/2014  . Allergic rhinitis 03/04/2014  . Arteriosclerosis of coronary artery 03/04/2014  . Diabetes mellitus (HCC) 03/04/2014  . Benign essential HTN 03/04/2014  . Acid reflux 03/04/2014  . HLD (hyperlipidemia) 03/04/2014  . Restless leg 03/04/2014  . Sensory neuropathy (HCC) 03/04/2014   Dollene Primrose, MS/CCC- SLP  Leandrew Koyanagi 09/24/2015, 9:57 AM  [facility] MAIN [facility] SERVICES [address] [place], [place], [address] Phone: [phone]   Fax:  [phone]   Name: Johnny Bowen MRN: [id number] Date of Birth: 01/22/1930

## 2015-09-24 NOTE — Therapy (Signed)
[facility] MAIN [facility] SERVICES [address] [place], [place], [address] Phone: [phone]   Fax:  [phone]  Physical Therapy Treatment  Patient Details  Name: Johnny Bowen MRN: [id number] Date of Birth: Apr 28, 1930 Referring Provider: shah  Encounter Date: 09/24/2015      PT End of Session - 09/24/15 1014    Visit Number 5   Number of Visits 17   Date for PT Re-Evaluation 2015/11/05   Authorization Type g codes   PT Start Time 1005   PT Stop Time 1100   PT Time Calculation (min) 55 min   Equipment Utilized During Treatment Gait belt      Past Medical History  Diagnosis Date  . Allergic rhinitis   . Anemia   . Arthritis   . CAD (coronary artery disease)   . Diabetes mellitus with neurological manifestations (HCC)   . Episodic atrial fibrillation (HCC)   . HTN (hypertension)   . GERD (gastroesophageal reflux disease)   . Heart block   . History of lower GI bleeding   . HLD (hyperlipidemia)   . MI (myocardial infarction) (HCC)   . Proteinuria   . RLS (restless legs syndrome)   . Shingles     Past Surgical History  Procedure Laterality Date  . Hernia repair    . Pacemaker placement    . Colonoscopy    . Esophagogastroduodenoscopy      There were no vitals filed for this visit.  Visit Diagnosis:  Difficulty walking  Muscle weakness  Decreased coordination  Numbness in feet      Subjective Assessment - 09/24/15 1013    Subjective Patient is doing his exercises.    Patient is accompained by: Family member   Pertinent History neuropathy, restless leg syndrome, ,Diabetes, high blood pressure, pace maker , a -fib, leg cramps, trouble with steps, rising from a chair,           Floor to ceiling x 10 reps, side to side 10 reps, step and reach forward x 10 reps, step and reach backwards x 10, step and reach sideways x 10 , Rock and reach forward/backward x 10 , Rock and reach sideways x 10, functional tasks; 1  sit to stand 2. Coordination hand tasks and fine motor skills practice with coins, card shuffling. . Walking BIG with UE's swing Patient has unsteady stepping with several exercises but his motor control improve with practice. Patient needs occasional verbal cueing to improve posture and cueing to correctly perform exercises slowly, holding at end of range to increase motor firing of desired muscle to encourage fatigue                                  PT Education - 09/24/15 1013    Education provided Yes   Education Details LSVT BIG   Person(s) Educated Patient   Methods Explanation   Comprehension Verbalized understanding             PT Long Term Goals - 09/12/15 0933    PT LONG TERM GOAL #1   Title Patient will be independent in home exercise program to improve strength/mobility for better functional independence with ADLs.   Time 4   Period Weeks   Status New   PT LONG TERM GOAL #2   Title Patient (< 17 years old) will complete five times sit to stand test in < 10 seconds indicating  an increased LE strength and improved balance.   Time 4   Period Weeks   Status New   PT LONG TERM GOAL #3   Title Patient will increase six minute walk test distance to >1000 for progression to community ambulator and improve gait ability   Time 4   Period Weeks   Status New   PT LONG TERM GOAL #4   Title Patient will increase 10 meter walk test to >1.35m/s as to improve gait speed for better community ambulation and to reduce fall risk   Time 4   Period Weeks   PT LONG TERM GOAL #5   Title Patient will reduce timed up and go to <11 seconds to reduce fall risk and demonstrate improved transfer/gait ability   Time 4   Period Weeks               Plan - 09/24/15 1018    Clinical Impression Statement Min cueing needed to appropriately perform LSVT tasks with leg, hand, and head position. Decreased coordination demonstrated requiring consistent verbal cueing  to correct form. Cognitive understanding of task was delayed. Patient continues to demonstrate some in coordination of movement with select exercises such as rock and reach and stepping backwards. Patient responds well to verbal and tactile cues to correct form and technique.  CGA to SBA for safety with activities.  Uses to increase intensity and amplitude of movements throughout session   Pt will benefit from skilled therapeutic intervention in order to improve on the following deficits Abnormal gait;Decreased strength;Increased muscle spasms;Postural dysfunction;Difficulty walking;Decreased activity tolerance;Impaired sensation;Decreased balance;Decreased cognition;Pain;Decreased coordination;Decreased endurance   Rehab Potential Good   Clinical Impairments Affecting Rehab Potential left shoulder pain with certian positions   PT Frequency 4x / week   PT Duration 4 weeks   PT Treatment/Interventions ADLs/Self Care Home Management;Stair training;Gait training;Therapeutic exercise;Balance training;Neuromuscular re-education;Therapeutic activities   Consulted and Agree with Plan of Care Patient;Family member/caregiver        Problem List Patient Active Problem List   Diagnosis Date Noted  . HB (heart block) 07/26/2015  . Myocardial infarction (HCC) 07/26/2015  . Parkinson's disease (HCC) 04/07/2014  . Absolute anemia 03/06/2014  . Paroxysmal atrial fibrillation (HCC) 03/06/2014  . Abnormal presence of protein in urine 03/06/2014  . Allergic rhinitis 03/04/2014  . Arteriosclerosis of coronary artery 03/04/2014  . Diabetes mellitus (HCC) 03/04/2014  . Benign essential HTN 03/04/2014  . Acid reflux 03/04/2014  . HLD (hyperlipidemia) 03/04/2014  . Restless leg 03/04/2014  . Sensory neuropathy [facility]) 03/04/2014    Johnny Bowen 09/24/2015, 10:42 AM  [place] [facility] MAIN [facility] SERVICES [address] [place], [place], [address] Phone: [phone]    Fax:  [phone]  Name: Johnny Bowen MRN: [id number] Date of Birth: 03-Aug-1930

## 2015-09-25 ENCOUNTER — Encounter: Payer: Self-pay | Admitting: Physical Therapy

## 2015-09-25 ENCOUNTER — Ambulatory Visit: Payer: Medicare Other | Admitting: Physical Therapy

## 2015-09-25 ENCOUNTER — Encounter: Payer: Self-pay | Admitting: Speech Pathology

## 2015-09-25 ENCOUNTER — Ambulatory Visit: Payer: Medicare Other | Admitting: Speech Pathology

## 2015-09-25 DIAGNOSIS — R279 Unspecified lack of coordination: Secondary | ICD-10-CM

## 2015-09-25 DIAGNOSIS — R262 Difficulty in walking, not elsewhere classified: Secondary | ICD-10-CM

## 2015-09-25 DIAGNOSIS — R278 Other lack of coordination: Secondary | ICD-10-CM

## 2015-09-25 DIAGNOSIS — M6281 Muscle weakness (generalized): Secondary | ICD-10-CM

## 2015-09-25 DIAGNOSIS — R490 Dysphonia: Secondary | ICD-10-CM

## 2015-09-25 DIAGNOSIS — R200 Anesthesia of skin: Secondary | ICD-10-CM

## 2015-09-25 NOTE — Therapy (Signed)
[facility] MAIN [facility] SERVICES [address] [place], [place], [address] Phone: [phone]   Fax:  [phone]  Physical Therapy Treatment  Patient Details  Name: Johnny Bowen MRN: [id number] Date of Birth: 10/21/1929 Referring Provider: shah  Encounter Date: 09/25/2015      PT End of Session - 09/25/15 0959    Visit Number 5   Number of Visits 17   Date for PT Re-Evaluation 10/27/15   Authorization Type g codes   PT Start Time 1000   PT Stop Time 1100   PT Time Calculation (min) 60 min   Equipment Utilized During Treatment Gait belt      Past Medical History  Diagnosis Date  . Allergic rhinitis   . Anemia   . Arthritis   . CAD (coronary artery disease)   . Diabetes mellitus with neurological manifestations (HCC)   . Episodic atrial fibrillation (HCC)   . HTN (hypertension)   . GERD (gastroesophageal reflux disease)   . Heart block   . History of lower GI bleeding   . HLD (hyperlipidemia)   . MI (myocardial infarction) (HCC)   . Proteinuria   . RLS (restless legs syndrome)   . Shingles     Past Surgical History  Procedure Laterality Date  . Hernia repair    . Pacemaker placement    . Colonoscopy    . Esophagogastroduodenoscopy      There were no vitals filed for this visit.  Visit Diagnosis:  Difficulty walking  Muscle weakness  Decreased coordination  Numbness in feet      Subjective Assessment - 09/25/15 0958    Subjective Patient is doing his exercises.    Patient is accompained by: Family member   Pertinent History neuropathy, restless leg syndrome, ,Diabetes, high blood pressure, pace maker , a -fib, leg cramps, trouble with steps, rising from a chair,     Floor to ceiling x 10 reps, side to side 10 reps, step and reach forward x 10 reps, step and reach backwards x 10, step and reach sideways x 10 , Rock and reach forward/backward x 10 , Rock and reach sideways x 10, functional tasks; 1 sit to stand  2. Coordination hand tasks and fine motor skills practice with coins, card shuffling, screwing screws small to big. Walking BIG with UE's swing Patient has unsteady stepping with several exercises but his motor control improve with practice. Patient needs occasional verbal cueing to improve posture and cueing to correctly perform exercises slowly, holding at end of range to increase motor firing of desired muscle to encourage fatigue                            PT Education - 09/25/15 0958    Education provided Yes   Education Details LSVT BIG   Person(s) Educated Patient   Methods Explanation   Comprehension Verbalized understanding             PT Long Term Goals - 09/12/15 0933    PT LONG TERM GOAL #1   Title Patient will be independent in home exercise program to improve strength/mobility for better functional independence with ADLs.   Time 4   Period Weeks   Status New   PT LONG TERM GOAL #2   Title Patient (< 60 years old) will complete five times sit to stand test in < 10 seconds indicating an increased LE strength and improved balance.  Time 4   Period Weeks   Status New   PT LONG TERM GOAL #3   Title Patient will increase six minute walk test distance to >1000 for progression to community ambulator and improve gait ability   Time 4   Period Weeks   Status New   PT LONG TERM GOAL #4   Title Patient will increase 10 meter walk test to >1.13m/s as to improve gait speed for better community ambulation and to reduce fall risk   Time 4   Period Weeks   PT LONG TERM GOAL #5   Title Patient will reduce timed up and go to <11 seconds to reduce fall risk and demonstrate improved transfer/gait ability   Time 4   Period Weeks               Plan - 09/25/15 0959    Clinical Impression Statement Patient continues to demonstrates less incoordination of movement with select exercises such as rock and reach and stepping backwards. Patient responds  well to verbal and tactile cues to correct form and technique. Patient is able to catch mistakes in technique with incorrect positions and is able remember the start and finish positions. Motor control of LE much improved.  Muscle fatigue but no major pain complaints   Pt will benefit from skilled therapeutic intervention in order to improve on the following deficits Abnormal gait;Decreased strength;Increased muscle spasms;Postural dysfunction;Difficulty walking;Decreased activity tolerance;Impaired sensation;Decreased balance;Decreased cognition;Pain;Decreased coordination;Decreased endurance   Rehab Potential Good   Clinical Impairments Affecting Rehab Potential left shoulder pain with certian positions   PT Frequency 4x / week   PT Duration 4 weeks   PT Treatment/Interventions ADLs/Self Care Home Management;Stair training;Gait training;Therapeutic exercise;Balance training;Neuromuscular re-education;Therapeutic activities   Consulted and Agree with Plan of Care Patient;Family member/caregiver        Problem List Patient Active Problem List   Diagnosis Date Noted  . HB (heart block) 07/26/2015  . Myocardial infarction (HCC) 07/26/2015  . Parkinson's disease (HCC) 04/07/2014  . Absolute anemia 03/06/2014  . Paroxysmal atrial fibrillation (HCC) 03/06/2014  . Abnormal presence of protein in urine 03/06/2014  . Allergic rhinitis 03/04/2014  . Arteriosclerosis of coronary artery 03/04/2014  . Diabetes mellitus (HCC) 03/04/2014  . Benign essential HTN 03/04/2014  . Acid reflux 03/04/2014  . HLD (hyperlipidemia) 03/04/2014  . Restless leg 03/04/2014  . Sensory neuropathy [facility]) 03/04/2014    [name] 09/25/2015, 10:01 AM  [facility] MAIN [facility] SERVICES [address] [place], [place], [address] Phone: [phone]   Fax:  [phone]  Name: Johnny Bowen MRN: [id number] Date of Birth: 12-Apr-1930

## 2015-09-25 NOTE — Therapy (Signed)
[facility] MAIN [facility] SERVICES [address] [place], [place], [address] Phone: [phone]   Fax:  [phone]  Speech Language Pathology Treatment  Patient Details  Name: Johnny Bowen MRN: [id number] Date of Birth: 1930-02-27 Referring Provider: Dr. Sherryll Burger  Encounter Date: 09/25/2015      End of Session - 09/25/15 1003    Visit Number 5   Number of Visits 17   Date for SLP Re-Evaluation 10/19/15   SLP Start Time 0900   SLP Stop Time  0953   SLP Time Calculation (min) 53 min   Activity Tolerance Patient tolerated treatment well      Past Medical History  Diagnosis Date  . Allergic rhinitis   . Anemia   . Arthritis   . CAD (coronary artery disease)   . Diabetes mellitus with neurological manifestations (HCC)   . Episodic atrial fibrillation (HCC)   . HTN (hypertension)   . GERD (gastroesophageal reflux disease)   . Heart block   . History of lower GI bleeding   . HLD (hyperlipidemia)   . MI (myocardial infarction) (HCC)   . Proteinuria   . RLS (restless legs syndrome)   . Shingles     Past Surgical History  Procedure Laterality Date  . Hernia repair    . Pacemaker placement    . Colonoscopy    . Esophagogastroduodenoscopy      There were no vitals filed for this visit.  Visit Diagnosis: Dysphonia      Subjective Assessment - 09/25/15 1000    Subjective The patient's wife agrees that the patient is articulating better as he learns to be loud.   Patient is accompained by: Family member   Currently in Pain? No/denies               ADULT SLP TREATMENT - 09/25/15 0001    General Information   Behavior/Cognition Alert;Cooperative;Pleasant mood;Hard of hearing   HPI Parkinson's disease   Treatment Provided   Treatment provided Cognitive-Linquistic   Pain Assessment   Pain Assessment No/denies pain   Cognitive-Linquistic Treatment   Treatment focused on Voice   Skilled Treatment Daily Task #1: Average 15  seconds, 85 dB. Daily Task 2: Highs: 15 high pitched "ah" given min-mod cues. Lows: 15 low pitched "ah" given min-mod cues. Daily task #3: Average 75 dB.  Hierarchal speech loudness drill: Read sentences, 73 dB (requires fewer cues to slow rate and articulate clearly).  Generate short responses, 75 dB with min SLP cues.  Homework: assignments given.  Off the cuff remarks: average 68 dB.   Assessment / Recommendations / Plan   Plan Continue with current plan of care   Progression Toward Goals   Progression toward goals Progressing toward goals          SLP Education - 09/25/15 1003    Education provided Yes   Education Details LSVT-LOUD   Person(s) Educated Patient;Spouse   Methods Explanation;Demonstration;Verbal cues;Handout   Comprehension Verbalized understanding;Returned demonstration;Verbal cues required;Need further instruction            SLP Long Term Goals - 09/12/15 1355    SLP LONG TERM GOAL #1   Title The patient will complete Daily Tasks (Maximum duration "ah", High/Lows, and Functional Phrases) at average loudness of 80 dB and with loud, good quality voice.    Time 4   Period Weeks   Status New   SLP LONG TERM GOAL #2   Title The patient will complete Hierarchal Speech  Loudness reading drills (words/phrases, sentences, and paragraph) at average 75 dB and with loud, good quality voice.     Time 4   Period Weeks   Status New   SLP LONG TERM GOAL #3   Title The patient will complete homework daily.   Time 4   Period Weeks   Status New   SLP LONG TERM GOAL #4   Title The patient will participate in conversation, maintaining average loudness of 75 dB and loud, good quality voice.   Time 4   Period Weeks   Status New          Plan - 09/25/15 1004    Clinical Impression Statement The patient is completing daily tasks and hierarchal speech drill tasks with loud, good quality voice given fewer SLP cues. Overall speech intelligibility is much improved as well.   The patient is demonstrating emerging generalization into conversational speech.   Speech Therapy Frequency 4x / week   Duration 4 weeks   Treatment/Interventions Other (comment)  LSVT-LOUD   Potential to Achieve Goals Good   Potential Considerations Ability to learn/carryover information;Cooperation/participation level;Medical prognosis;Previous level of function;Severity of impairments;Family/community support   SLP Home Exercise Plan LSVT-LOUD daily homework   Consulted and Agree with Plan of Care Patient   Family Member Consulted Spouse        Problem List Patient Active Problem List   Diagnosis Date Noted  . HB (heart block) 07/26/2015  . Myocardial infarction (HCC) 07/26/2015  . Parkinson's disease (HCC) 04/07/2014  . Absolute anemia 03/06/2014  . Paroxysmal atrial fibrillation (HCC) 03/06/2014  . Abnormal presence of protein in urine 03/06/2014  . Allergic rhinitis 03/04/2014  . Arteriosclerosis of coronary artery 03/04/2014  . Diabetes mellitus (HCC) 03/04/2014  . Benign essential HTN 03/04/2014  . Acid reflux 03/04/2014  . HLD (hyperlipidemia) 03/04/2014  . Restless leg 03/04/2014  . Sensory neuropathy (HCC) 03/04/2014   Dollene Primrose, MS/CCC- SLP  Leandrew Koyanagi 09/25/2015, 10:05 AM  [place] [facility] MAIN [facility] SERVICES [address] [place], [place], [address] Phone: [phone]   Fax:  [phone]   Name: Johnny Bowen MRN: [id number] Date of Birth: 1930-03-18

## 2015-09-26 ENCOUNTER — Encounter: Payer: Self-pay | Admitting: Physical Therapy

## 2015-09-26 ENCOUNTER — Ambulatory Visit: Payer: Medicare Other | Admitting: Speech Pathology

## 2015-09-26 ENCOUNTER — Encounter: Payer: Self-pay | Admitting: Speech Pathology

## 2015-09-26 ENCOUNTER — Ambulatory Visit: Payer: Medicare Other | Admitting: Physical Therapy

## 2015-09-26 DIAGNOSIS — R200 Anesthesia of skin: Secondary | ICD-10-CM

## 2015-09-26 DIAGNOSIS — R490 Dysphonia: Secondary | ICD-10-CM

## 2015-09-26 DIAGNOSIS — R262 Difficulty in walking, not elsewhere classified: Secondary | ICD-10-CM

## 2015-09-26 DIAGNOSIS — M6281 Muscle weakness (generalized): Secondary | ICD-10-CM

## 2015-09-26 DIAGNOSIS — R278 Other lack of coordination: Secondary | ICD-10-CM

## 2015-09-26 DIAGNOSIS — R279 Unspecified lack of coordination: Secondary | ICD-10-CM

## 2015-09-26 NOTE — Therapy (Signed)
[place] [facility] MAIN [facility] SERVICES [address] [place], [place], [address] Phone: [phone]   Fax:  [phone]  Speech Language Pathology Treatment  Patient Details  Name: Johnny Bowen MRN: [id number] Date of Birth: Sep 02, 1930 Referring Provider: Dr. Sherryll Burger  Encounter Date: 09/26/2015      End of Session - 09/26/15 0956    Visit Number 6   Number of Visits 17   Date for SLP Re-Evaluation 10/19/15   SLP Start Time 0900   SLP Stop Time  0954   SLP Time Calculation (min) 54 min   Activity Tolerance Patient tolerated treatment well      Past Medical History  Diagnosis Date  . Allergic rhinitis   . Anemia   . Arthritis   . CAD (coronary artery disease)   . Diabetes mellitus with neurological manifestations (HCC)   . Episodic atrial fibrillation (HCC)   . HTN (hypertension)   . GERD (gastroesophageal reflux disease)   . Heart block   . History of lower GI bleeding   . HLD (hyperlipidemia)   . MI (myocardial infarction) (HCC)   . Proteinuria   . RLS (restless legs syndrome)   . Shingles     Past Surgical History  Procedure Laterality Date  . Hernia repair    . Pacemaker placement    . Colonoscopy    . Esophagogastroduodenoscopy      There were no vitals filed for this visit.  Visit Diagnosis: Dysphonia      Subjective Assessment - 09/26/15 0955    Subjective The patient's wife agrees that the patient is articulating better as he learns to be loud.   Patient is accompained by: Family member   Currently in Pain? No/denies               ADULT SLP TREATMENT - 09/26/15 0001    General Information   Behavior/Cognition Alert;Cooperative;Pleasant mood;Hard of hearing   HPI Parkinson's disease   Treatment Provided   Treatment provided Cognitive-Linquistic   Pain Assessment   Pain Assessment No/denies pain   Cognitive-Linquistic Treatment   Treatment focused on Voice   Skilled Treatment Daily Task #1: Average 10  seconds, 85 dB. Daily Task 2: Highs: 15 high pitched "ah" given min-mod cues. Lows: 15 low pitched "ah" given min-mod cues. Daily task #3: Average 75 dB.  Hierarchal speech loudness drill: Generate sentences, 73 dB (requires fewer cues to slow rate and articulate clearly).  Homework: assignments given.  Off the cuff remarks: average 71 dB.   Assessment / Recommendations / Plan   Plan Continue with current plan of care   Progression Toward Goals   Progression toward goals Progressing toward goals          SLP Education - 09/26/15 0955    Education provided Yes   Education Details LSVT-LOUD   Person(s) Educated Patient;Spouse   Methods Explanation;Demonstration;Verbal cues;Handout   Comprehension Verbalized understanding;Returned demonstration;Verbal cues required;Need further instruction            SLP Long Term Goals - 09/12/15 1355    SLP LONG TERM GOAL #1   Title The patient will complete Daily Tasks (Maximum duration "ah", High/Lows, and Functional Phrases) at average loudness of 80 dB and with loud, good quality voice.    Time 4   Period Weeks   Status New   SLP LONG TERM GOAL #2   Title The patient will complete Hierarchal Speech Loudness reading drills (words/phrases, sentences, and paragraph) at average 75  dB and with loud, good quality voice.     Time 4   Period Weeks   Status New   SLP LONG TERM GOAL #3   Title The patient will complete homework daily.   Time 4   Period Weeks   Status New   SLP LONG TERM GOAL #4   Title The patient will participate in conversation, maintaining average loudness of 75 dB and loud, good quality voice.   Time 4   Period Weeks   Status New          Plan - 09/26/15 0956    Clinical Impression Statement The patient is completing daily tasks and hierarchal speech drill tasks with loud, good quality voice given fewer SLP cues. Overall speech intelligibility is much improved as well.  The patient is demonstrating emerging  generalization into conversational speech.   Speech Therapy Frequency 4x / week   Duration 4 weeks   Treatment/Interventions Other (comment)  LSVT-LOUD   Potential to Achieve Goals Good   Potential Considerations Ability to learn/carryover information;Cooperation/participation level;Medical prognosis;Previous level of function;Severity of impairments;Family/community support   SLP Home Exercise Plan LSVT-LOUD daily homework   Consulted and Agree with Plan of Care Patient   Family Member Consulted Spouse        Problem List Patient Active Problem List   Diagnosis Date Noted  . HB (heart block) 07/26/2015  . Myocardial infarction (HCC) 07/26/2015  . Parkinson's disease (HCC) 04/07/2014  . Absolute anemia 03/06/2014  . Paroxysmal atrial fibrillation (HCC) 03/06/2014  . Abnormal presence of protein in urine 03/06/2014  . Allergic rhinitis 03/04/2014  . Arteriosclerosis of coronary artery 03/04/2014  . Diabetes mellitus (HCC) 03/04/2014  . Benign essential HTN 03/04/2014  . Acid reflux 03/04/2014  . HLD (hyperlipidemia) 03/04/2014  . Restless leg 03/04/2014  . Sensory neuropathy (HCC) 03/04/2014   Dollene Primrose, MS/CCC- SLP  Leandrew Koyanagi 09/26/2015, 9:57 AM  [place] [facility] MAIN [facility] SERVICES [address] [place], [place], [address] Phone: [phone]   Fax:  [phone]   Name: TREI SCHOCH MRN: [id number] Date of Birth: 04/15/1930

## 2015-09-26 NOTE — Therapy (Signed)
[facility] MAIN [facility] SERVICES [address] [place], [place], [address] Phone: [phone]   Fax:  [phone]  Physical Therapy Treatment  Patient Details  Name: Johnny Bowen MRN: [id number] Date of Birth: December 06, 1929 Referring Provider: shah  Encounter Date: 09/26/2015      PT End of Session - 09/26/15 0940    Visit Number 6   Number of Visits 17   Date for PT Re-Evaluation 2015/11/12   Authorization Type g codes   PT Start Time 1000   PT Stop Time 1100   PT Time Calculation (min) 60 min   Equipment Utilized During Treatment Gait belt      Past Medical History  Diagnosis Date  . Allergic rhinitis   . Anemia   . Arthritis   . CAD (coronary artery disease)   . Diabetes mellitus with neurological manifestations (HCC)   . Episodic atrial fibrillation (HCC)   . HTN (hypertension)   . GERD (gastroesophageal reflux disease)   . Heart block   . History of lower GI bleeding   . HLD (hyperlipidemia)   . MI (myocardial infarction) (HCC)   . Proteinuria   . RLS (restless legs syndrome)   . Shingles     Past Surgical History  Procedure Laterality Date  . Hernia repair    . Pacemaker placement    . Colonoscopy    . Esophagogastroduodenoscopy      There were no vitals filed for this visit.  Visit Diagnosis:  Difficulty walking  Muscle weakness  Decreased coordination  Numbness in feet      Subjective Assessment - 09/26/15 0940    Subjective Patient is doing his exercises.    Patient is accompained by: Family member   Pertinent History neuropathy, restless leg syndrome, ,Diabetes, high blood pressure, pace maker , a -fib, leg cramps, trouble with steps, rising from a chair,    Currently in Pain? No/denies      Floor to ceiling x 10 reps, side to side 10 reps, step and reach forward x 10 reps, step and reach backwards x 10,  step and reach sideways x 10 , Rock and reach forward/backward x 10 , Rock and reach sideways x  10, functional tasks; 1 sit to stand  2. Coordination hand tasks and fine motor skills practice with coins, card shuffling. . Walking BIG with UE's swing  Patient has unsteady stepping with several exercises but his motor control improve with practice. Patient needs occasional verbal cueing to improve posture and cueing to correctly perform exercises slowly, holding at end of range to increase motor firing of desired muscle to encourage fatigue                           PT Education - 09/26/15 0940    Education provided Yes   Person(s) Educated Patient   Methods Explanation   Comprehension Verbalized understanding             PT Long Term Goals - 09/12/15 0933    PT LONG TERM GOAL #1   Title Patient will be independent in home exercise program to improve strength/mobility for better functional independence with ADLs.   Time 4   Period Weeks   Status New   PT LONG TERM GOAL #2   Title Patient (< 53 years old) will complete five times sit to stand test in < 10 seconds indicating an increased LE strength and improved balance.  Time 4   Period Weeks   Status New   PT LONG TERM GOAL #3   Title Patient will increase six minute walk test distance to >1000 for progression to community ambulator and improve gait ability   Time 4   Period Weeks   Status New   PT LONG TERM GOAL #4   Title Patient will increase 10 meter walk test to >1.24m/s as to improve gait speed for better community ambulation and to reduce fall risk   Time 4   Period Weeks   PT LONG TERM GOAL #5   Title Patient will reduce timed up and go to <11 seconds to reduce fall risk and demonstrate improved transfer/gait ability   Time 4   Period Weeks               Plan - 09/26/15 0943    Clinical Impression Statement Patient continues to demonstrates less incoordination of movement with select exercises such as rock and reach and stepping backwards. Patient responds well to verbal and  tactile cues to correct form and technique. Patient is able to catch mistakes in technique with incorrect positions and is able remember the start and finish positions. Motor control of LE much improved.  Muscle fatigue but no major pain complaints.   Pt will benefit from skilled therapeutic intervention in order to improve on the following deficits Abnormal gait;Decreased strength;Increased muscle spasms;Postural dysfunction;Difficulty walking;Decreased activity tolerance;Impaired sensation;Decreased balance;Decreased cognition;Pain;Decreased coordination;Decreased endurance   Rehab Potential Good   Clinical Impairments Affecting Rehab Potential left shoulder pain with certian positions   PT Frequency 4x / week   PT Duration 4 weeks   PT Treatment/Interventions ADLs/Self Care Home Management;Stair training;Gait training;Therapeutic exercise;Balance training;Neuromuscular re-education;Therapeutic activities   Consulted and Agree with Plan of Care Patient;Family member/caregiver        Problem List Patient Active Problem List   Diagnosis Date Noted  . HB (heart block) 07/26/2015  . Myocardial infarction (HCC) 07/26/2015  . Parkinson's disease (HCC) 04/07/2014  . Absolute anemia 03/06/2014  . Paroxysmal atrial fibrillation (HCC) 03/06/2014  . Abnormal presence of protein in urine 03/06/2014  . Allergic rhinitis 03/04/2014  . Arteriosclerosis of coronary artery 03/04/2014  . Diabetes mellitus (HCC) 03/04/2014  . Benign essential HTN 03/04/2014  . Acid reflux 03/04/2014  . HLD (hyperlipidemia) 03/04/2014  . Restless leg 03/04/2014  . Sensory neuropathy (HCC) 03/04/2014   [name], PT, DPT [place] 09/26/2015, 12:40 PM  [place] [facility] MAIN [facility] SERVICES [address] [place], [place], [address] Phone: [phone]   Fax:  [phone]  Name: Johnny Bowen MRN: [id number] Date of Birth: Nov 07, 1929

## 2015-09-27 ENCOUNTER — Encounter: Payer: Self-pay | Admitting: Speech Pathology

## 2015-09-27 ENCOUNTER — Ambulatory Visit: Payer: Medicare Other | Admitting: Speech Pathology

## 2015-09-27 ENCOUNTER — Encounter: Payer: Self-pay | Admitting: Physical Therapy

## 2015-09-27 ENCOUNTER — Ambulatory Visit: Payer: Medicare Other | Admitting: Physical Therapy

## 2015-09-27 DIAGNOSIS — M6281 Muscle weakness (generalized): Secondary | ICD-10-CM

## 2015-09-27 DIAGNOSIS — R200 Anesthesia of skin: Secondary | ICD-10-CM

## 2015-09-27 DIAGNOSIS — R278 Other lack of coordination: Secondary | ICD-10-CM

## 2015-09-27 DIAGNOSIS — R279 Unspecified lack of coordination: Secondary | ICD-10-CM

## 2015-09-27 DIAGNOSIS — R490 Dysphonia: Secondary | ICD-10-CM | POA: Diagnosis not present

## 2015-09-27 DIAGNOSIS — R262 Difficulty in walking, not elsewhere classified: Secondary | ICD-10-CM

## 2015-09-27 NOTE — Therapy (Signed)
[place] MAIN [facility] SERVICES [address] [place], [place], [address] Phone: [phone]   Fax:  [phone]  Physical Therapy Treatment  Patient Details  Name: Johnny Bowen MRN: [id number] Date of Birth: 09-21-1929 Referring Provider: shah  Encounter Date: 09/27/2015      PT End of Session - 09/27/15 1107    Visit Number 7   Number of Visits 17   Date for PT Re-Evaluation 11-09-15   Authorization Type g codes   PT Start Time 1000   PT Stop Time 1100   PT Time Calculation (min) 60 min   Equipment Utilized During Treatment Gait belt   Activity Tolerance Patient tolerated treatment well      Past Medical History  Diagnosis Date  . Allergic rhinitis   . Anemia   . Arthritis   . CAD (coronary artery disease)   . Diabetes mellitus with neurological manifestations ([place])   . Episodic atrial fibrillation ([place])   . HTN (hypertension)   . GERD (gastroesophageal reflux disease)   . Heart block   . History of lower GI bleeding   . HLD (hyperlipidemia)   . MI (myocardial infarction) ([place])   . Proteinuria   . RLS (restless legs syndrome)   . Shingles     Past Surgical History  Procedure Laterality Date  . Hernia repair    . Pacemaker placement    . Colonoscopy    . Esophagogastroduodenoscopy      There were no vitals filed for this visit.  Visit Diagnosis:  Difficulty walking  Muscle weakness  Decreased coordination  Numbness in feet      Subjective Assessment - 09/27/15 1050    Subjective Patient is doing his exercises.    Patient is accompained by: Family member   Pertinent History neuropathy, restless leg syndrome, ,Diabetes, high blood pressure, pace maker , a -fib, leg cramps, trouble with steps, rising from a chair,        OUTCOME MEASURES: TEST Outcome Interpretation  5 times sit<>stand  sec.14.95 >60 yo, >15 sec indicates increased risk for falls  10 meter walk test  1.24 m/s <1.0  m/s indicates increased risk for falls; limited community ambulator  Timed up and Go 10.71 tug carry 12.40 tug cog 13.89  sec <14 sec indicates increased risk for falls  6 minute walk test  1205 Feet 1000 feet is community Water quality scientist  <36/56 (100% risk for falls), 37-45 (80% risk for falls); 46-51 (>50% risk for falls); 52-55 (lower risk <25% of falls)  9 Hole Peg Test L: 36.19 R: ,46.81   Floor to ceiling x 10 reps, side to side 10 reps, step and reach forward x 10 reps, step and reach backwards x 10,  step and reach sideways x 10 , Rock and reach forward/backward x 10 , Rock and reach sideways x 10, functional tasks; 1 sit to stand  2. Coordination hand tasks and fine motor skills practice with coins, card shuffling. . Walking BIG with UE's swing  Patient has unsteady stepping with several exercises but his motor control improve with practice. Patient needs occasional verbal cueing to improve posture and cueing to correctly perform exercises slowly, holding at end of range to increase motor firing of desired muscle to encourage fatigue  PT Education - 2015/10/13 1057    Education provided Yes   Education Details LSVT BIG   Person(s) Educated Patient   Methods Explanation   Comprehension Verbalized understanding             PT Long Term Goals - October 13, 2015 1113    PT LONG TERM GOAL #1   Title Patient will be independent in home exercise program to improve strength/mobility for better functional independence with ADLs.   Period Weeks   Status Partially Met   PT LONG TERM GOAL #2   Title Patient (< 49 years old) will complete five times sit to stand test in < 10 seconds indicating an increased LE strength and improved balance.   Period Weeks   Status Partially Met   PT LONG TERM GOAL #3   Title Patient will increase  six minute walk test distance to >1000 for progression to community ambulator and improve gait ability   Period Weeks   Status Partially Met   PT LONG TERM GOAL #4   Title Patient will increase 10 meter walk test to >1.40ms as to improve gait speed for better community ambulation and to reduce fall risk   Period Weeks   Status Partially Met   PT LONG TERM GOAL #5   Title Patient will reduce timed up and go to <11 seconds to reduce fall risk and demonstrate improved transfer/gait ability   Period Weeks   Status Partially Met               Plan - [id number]    Clinical Impression Statement Patient has made progress with outcome measures and coordination and will continue to benefit from skilled PT to reach his goals.   Pt will benefit from skilled therapeutic intervention in order to improve on the following deficits Abnormal gait;Decreased strength;Increased muscle spasms;Postural dysfunction;Difficulty walking;Decreased activity tolerance;Impaired sensation;Decreased balance;Decreased cognition;Pain;Decreased coordination;Decreased endurance   Rehab Potential Good   Clinical Impairments Affecting Rehab Potential left shoulder pain with certian positions   PT Frequency 4x / week   PT Duration 4 weeks   PT Treatment/Interventions ADLs/Self Care Home Management;Stair training;Gait training;Therapeutic exercise;Balance training;Neuromuscular re-education;Therapeutic activities   Consulted and Agree with Plan of Care Patient;Family member/caregiver          G-Codes - [id number]    Functional Assessment Tool Used 5 x sit to stand, TUG, 10 MW, 6 MW, peg test   Functional Limitation Mobility: Walking and moving around   Mobility: Walking and Moving Around Current Status [phone] At least 20 percent but less than 40 percent impaired, limited or restricted   Mobility: Walking and Moving Around Goal Status [phone] At least 1 percent but less than 20 percent impaired, limited or  restricted      Problem List Patient Active Problem List   Diagnosis Date Noted  . HB (heart block) 07/26/2015  . Myocardial infarction ([place] 07/26/2015  . Parkinson's disease ([place] 04/07/2014  . Absolute anemia 03/06/2014  . Paroxysmal atrial fibrillation ([place] 03/06/2014  . Abnormal presence of protein in urine 03/06/2014  . Allergic rhinitis 03/04/2014  . Arteriosclerosis of coronary artery 03/04/2014  . Diabetes mellitus ([place] 03/04/2014  . Benign essential HTN 03/04/2014  . Acid reflux 03/04/2014  . HLD (hyperlipidemia) 03/04/2014  . Restless leg 03/04/2014  . Sensory neuropathy ([place] 03/04/2014   KAlanson Puls PT, DPT [place] 102-04-17 11:16 AM  [facility] [address] [place] [address]  Phone: [phone]   Fax:  [phone]  Name: Johnny Bowen MRN: [id number] Date of Birth: [birth year]

## 2015-09-27 NOTE — Therapy (Signed)
[place] [facility] MAIN [facility] SERVICES [address] [place], [place], [address] Phone: [phone]   Fax:  [phone]  Speech Language Pathology Treatment  Patient Details  Name: Johnny Bowen MRN: [id number] Date of Birth: Aug 31, 1930 Referring Provider: Dr. Sherryll Burger  Encounter Date: 09/27/2015      End of Session - 09/27/15 1617    Visit Number 7   Number of Visits 17   Date for SLP Re-Evaluation 10/19/15   SLP Start Time 0900   SLP Stop Time  0955   SLP Time Calculation (min) 55 min      Past Medical History  Diagnosis Date  . Allergic rhinitis   . Anemia   . Arthritis   . CAD (coronary artery disease)   . Diabetes mellitus with neurological manifestations (HCC)   . Episodic atrial fibrillation (HCC)   . HTN (hypertension)   . GERD (gastroesophageal reflux disease)   . Heart block   . History of lower GI bleeding   . HLD (hyperlipidemia)   . MI (myocardial infarction) (HCC)   . Proteinuria   . RLS (restless legs syndrome)   . Shingles     Past Surgical History  Procedure Laterality Date  . Hernia repair    . Pacemaker placement    . Colonoscopy    . Esophagogastroduodenoscopy      There were no vitals filed for this visit.  Visit Diagnosis: Dysphonia      Subjective Assessment - 09/27/15 1617    Subjective The patient's wife agrees that the patient is articulating better as he learns to be loud.   Patient is accompained by: Family member               ADULT SLP TREATMENT - 09/27/15 0001    General Information   Behavior/Cognition Alert;Cooperative;Pleasant mood;Hard of hearing   HPI Parkinson's disease   Treatment Provided   Treatment provided Cognitive-Linquistic   Pain Assessment   Pain Assessment No/denies pain   Cognitive-Linquistic Treatment   Treatment focused on Voice   Skilled Treatment Daily Task #1: Average 13 seconds, 82 dB. Daily Task 2: Highs: 15 high pitched "ah" given min-mod cues. Lows: 15  low pitched "ah" given min-mod cues. Daily task #3: Average 75 dB.  Hierarchal speech loudness drill: Generate sentences, 73 dB (requires fewer cues to slow rate and articulate clearly).  Homework: assignments given.  Off the cuff remarks: average 71 dB.   Assessment / Recommendations / Plan   Plan Continue with current plan of care   Progression Toward Goals   Progression toward goals Progressing toward goals          SLP Education - 09/27/15 1617    Education provided Yes   Education Details LSVT-LOUD   Person(s) Educated Patient;Spouse   Methods Explanation;Demonstration;Verbal cues;Handout   Comprehension Verbalized understanding;Returned demonstration;Verbal cues required;Need further instruction            SLP Long Term Goals - 09/12/15 1355    SLP LONG TERM GOAL #1   Title The patient will complete Daily Tasks (Maximum duration "ah", High/Lows, and Functional Phrases) at average loudness of 80 dB and with loud, good quality voice.    Time 4   Period Weeks   Status New   SLP LONG TERM GOAL #2   Title The patient will complete Hierarchal Speech Loudness reading drills (words/phrases, sentences, and paragraph) at average 75 dB and with loud, good quality voice.     Time 4  Period Weeks   Status New   SLP LONG TERM GOAL #3   Title The patient will complete homework daily.   Time 4   Period Weeks   Status New   SLP LONG TERM GOAL #4   Title The patient will participate in conversation, maintaining average loudness of 75 dB and loud, good quality voice.   Time 4   Period Weeks   Status New          Plan - 09/27/15 1618    Clinical Impression Statement The patient is completing daily tasks and hierarchal speech drill tasks with loud, good quality voice given fewer SLP cues. Overall speech intelligibility is much improved as well.  The patient is demonstrating emerging generalization into conversational speech.   Speech Therapy Frequency 4x / week   Duration 4  weeks   Treatment/Interventions Other (comment)  LSVT-LOUD   Potential to Achieve Goals Good   Potential Considerations Ability to learn/carryover information;Cooperation/participation level;Medical prognosis;Previous level of function;Severity of impairments;Family/community support   SLP Home Exercise Plan LSVT-LOUD daily homework   Consulted and Agree with Plan of Care Patient   Family Member Consulted Spouse        Problem List Patient Active Problem List   Diagnosis Date Noted  . HB (heart block) 07/26/2015  . Myocardial infarction (HCC) 07/26/2015  . Parkinson's disease (HCC) 04/07/2014  . Absolute anemia 03/06/2014  . Paroxysmal atrial fibrillation (HCC) 03/06/2014  . Abnormal presence of protein in urine 03/06/2014  . Allergic rhinitis 03/04/2014  . Arteriosclerosis of coronary artery 03/04/2014  . Diabetes mellitus (HCC) 03/04/2014  . Benign essential HTN 03/04/2014  . Acid reflux 03/04/2014  . HLD (hyperlipidemia) 03/04/2014  . Restless leg 03/04/2014  . Sensory neuropathy (HCC) 03/04/2014   Dollene Primrose, MS/CCC- SLP  Leandrew Koyanagi 09/27/2015, 4:20 PM  [place] [facility] MAIN [facility] SERVICES [address] [place], [place], [address] Phone: [phone]   Fax:  [phone]   Name: Johnny Bowen MRN: [id number] Date of Birth: 1930/04/13

## 2015-10-01 ENCOUNTER — Encounter: Payer: Self-pay | Admitting: Physical Therapy

## 2015-10-01 ENCOUNTER — Ambulatory Visit: Payer: Medicare Other | Admitting: Physical Therapy

## 2015-10-01 ENCOUNTER — Encounter: Payer: Self-pay | Admitting: Speech Pathology

## 2015-10-01 ENCOUNTER — Ambulatory Visit: Payer: Medicare Other | Admitting: Speech Pathology

## 2015-10-01 DIAGNOSIS — R490 Dysphonia: Secondary | ICD-10-CM | POA: Diagnosis not present

## 2015-10-01 DIAGNOSIS — R262 Difficulty in walking, not elsewhere classified: Secondary | ICD-10-CM

## 2015-10-01 DIAGNOSIS — R200 Anesthesia of skin: Secondary | ICD-10-CM

## 2015-10-01 DIAGNOSIS — M6281 Muscle weakness (generalized): Secondary | ICD-10-CM

## 2015-10-01 DIAGNOSIS — R278 Other lack of coordination: Secondary | ICD-10-CM

## 2015-10-01 DIAGNOSIS — R279 Unspecified lack of coordination: Secondary | ICD-10-CM

## 2015-10-01 NOTE — Therapy (Signed)
[facility] MAIN [facility] SERVICES [address] [place], [place], [address] Phone: [phone]   Fax:  [phone]  Speech Language Pathology Treatment  Patient Details  Name: Johnny Bowen MRN: [id number] Date of Birth: [birth year] Referring Provider: Dr. Sherryll Burger  Encounter Date: 10/01/2015      End of Session - 10/01/15 1059    Visit Number 8   Number of Visits 17   Date for SLP Re-Evaluation 10/19/15   SLP Start Time 0900   SLP Stop Time  0953   SLP Time Calculation (min) 53 min   Activity Tolerance Patient tolerated treatment well      Past Medical History  Diagnosis Date  . Allergic rhinitis   . Anemia   . Arthritis   . CAD (coronary artery disease)   . Diabetes mellitus with neurological manifestations (HCC)   . Episodic atrial fibrillation (HCC)   . HTN (hypertension)   . GERD (gastroesophageal reflux disease)   . Heart block   . History of lower GI bleeding   . HLD (hyperlipidemia)   . MI (myocardial infarction) (HCC)   . Proteinuria   . RLS (restless legs syndrome)   . Shingles     Past Surgical History  Procedure Laterality Date  . Hernia repair    . Pacemaker placement    . Colonoscopy    . Esophagogastroduodenoscopy      There were no vitals filed for this visit.  Visit Diagnosis: Dysphonia      Subjective Assessment - 10/01/15 1058    Subjective The patient's wife agrees that the patient is articulating better as he learns to be loud.   Patient is accompained by: Family member   Currently in Pain? No/denies               ADULT SLP TREATMENT - 10/01/15 0001    General Information   Behavior/Cognition Alert;Cooperative;Pleasant mood;Hard of hearing   HPI Parkinson's disease   Treatment Provided   Treatment provided Cognitive-Linquistic   Pain Assessment   Pain Assessment No/denies pain   Cognitive-Linquistic Treatment   Treatment focused on Voice   Skilled Treatment Daily Task #1: Average 13  seconds, 82 dB, patient requiring increased cuing for quality. Daily Task 2: Highs: 15 high pitched "ah" given min-mod cues. Lows: 15 low pitched "ah" given min-mod cues. Daily task #3: Average 75 dB.  Hierarchal speech loudness drill: Generate sentences, 73 dB (requires fewer cues to slow rate and articulate clearly).  Homework: assignments given.  Off the cuff remarks: average 71 dB.   Assessment / Recommendations / Plan   Plan Continue with current plan of care   Progression Toward Goals   Progression toward goals Progressing toward goals          SLP Education - 10/01/15 1058    Education provided Yes   Education Details LSVT-LOUD   Person(s) Educated Patient;Spouse   Methods Explanation;Demonstration;Verbal cues;Handout   Comprehension Verbalized understanding;Returned demonstration;Verbal cues required;Need further instruction            SLP Long Term Goals - 09/12/15 1355    SLP LONG TERM GOAL #1   Title The patient will complete Daily Tasks (Maximum duration "ah", High/Lows, and Functional Phrases) at average loudness of 80 dB and with loud, good quality voice.    Time 4   Period Weeks   Status New   SLP LONG TERM GOAL #2   Title The patient will complete Hierarchal Speech Loudness reading drills (words/phrases,  sentences, and paragraph) at average 75 dB and with loud, good quality voice.     Time 4   Period Weeks   Status New   SLP LONG TERM GOAL #3   Title The patient will complete homework daily.   Time 4   Period Weeks   Status New   SLP LONG TERM GOAL #4   Title The patient will participate in conversation, maintaining average loudness of 75 dB and loud, good quality voice.   Time 4   Period Weeks   Status New          Plan - 10/01/15 1100    Clinical Impression Statement The patient is completing daily tasks and hierarchal speech drill tasks with loud, good quality voice given SLP cues for quality. Overall speech intelligibility remains much improved.  The patient is demonstrating emerging generalization into conversational speech.   Speech Therapy Frequency 4x / week   Duration 4 weeks   Treatment/Interventions Other (comment)  LSVT-LOUD   Potential to Achieve Goals Good   Potential Considerations Ability to learn/carryover information;Cooperation/participation level;Medical prognosis;Previous level of function;Severity of impairments;Family/community support   SLP Home Exercise Plan LSVT-LOUD daily homework   Consulted and Agree with Plan of Care Patient   Family Member Consulted Spouse        Problem List Patient Active Problem List   Diagnosis Date Noted  . HB (heart block) 07/26/2015  . Myocardial infarction (HCC) 07/26/2015  . Parkinson's disease (HCC) 04/07/2014  . Absolute anemia 03/06/2014  . Paroxysmal atrial fibrillation (HCC) 03/06/2014  . Abnormal presence of protein in urine 03/06/2014  . Allergic rhinitis 03/04/2014  . Arteriosclerosis of coronary artery 03/04/2014  . Diabetes mellitus (HCC) 03/04/2014  . Benign essential HTN 03/04/2014  . Acid reflux 03/04/2014  . HLD (hyperlipidemia) 03/04/2014  . Restless leg 03/04/2014  . Sensory neuropathy (HCC) 03/04/2014   Dollene Primrose, MS/CCC- SLP  Leandrew Koyanagi 10/01/2015, 11:01 AM  [place] [facility] MAIN [facility] SERVICES [address] [place], [place], [address] Phone: [phone]   Fax:  [phone]   Name: Johnny Bowen MRN: [id number] Date of Birth: 07/24/1930

## 2015-10-01 NOTE — Therapy (Signed)
[place] MAIN [facility] SERVICES [address] [place], [place], [address] Phone: [phone]   Fax:  [phone]  Physical Therapy Treatment  Patient Details  Name: Johnny Bowen MRN: [id number] Date of Birth: 05/26/1930 Referring Provider: shah  Encounter Date: 10/01/2015      PT End of Session - 10/01/15 1111    Visit Number 8   Number of Visits 17   Date for PT Re-Evaluation 10-22-2015   Authorization Type g codes   PT Start Time 1000   PT Stop Time 1100   PT Time Calculation (min) 60 min   Equipment Utilized During Treatment Gait belt   Activity Tolerance Patient tolerated treatment well      Past Medical History  Diagnosis Date  . Allergic rhinitis   . Anemia   . Arthritis   . CAD (coronary artery disease)   . Diabetes mellitus with neurological manifestations ([place])   . Episodic atrial fibrillation ([place])   . HTN (hypertension)   . GERD (gastroesophageal reflux disease)   . Heart block   . History of lower GI bleeding   . HLD (hyperlipidemia)   . MI (myocardial infarction) ([place])   . Proteinuria   . RLS (restless legs syndrome)   . Shingles     Past Surgical History  Procedure Laterality Date  . Hernia repair    . Pacemaker placement    . Colonoscopy    . Esophagogastroduodenoscopy      There were no vitals filed for this visit.  Visit Diagnosis:  Difficulty walking  Muscle weakness  Decreased coordination  Numbness in feet      Subjective Assessment - 10/01/15 1110    Subjective Patient is doing his exercises.    Patient is accompained by: Family member   Pertinent History neuropathy, restless leg syndrome, ,Diabetes, high blood pressure, pace maker , a -fib, leg cramps, trouble with steps, rising from a chair,    Currently in Pain? No/denies      Min cueing needed to appropriately perform LSVT tasks with leg, hand, and head position. Decreased coordination demonstrated requiring consistent verbal  cueing to correct form. Cognitive understanding of task was delayed. Patient continues to demonstrate some in coordination of movement with select exercises such as rock and reach and stepping backwards. Patient responds well to verbal and tactile cues to correct form and technique.  CGA to SBA for safety with activities.  Uses to increase intensity and amplitude of movements throughout session                            PT Education - 10/01/15 1111    Education provided Yes   Education Details LSVT BIG   Person(s) Educated Patient   Methods Explanation   Comprehension Verbalized understanding             PT Long Term Goals - 09/27/15 1113    PT LONG TERM GOAL #1   Title Patient will be independent in home exercise program to improve strength/mobility for better functional independence with ADLs.   Period Weeks   Status Partially Met   PT LONG TERM GOAL #2   Title Patient (< 63 years old) will complete five times sit to stand test in < 10 seconds indicating an increased LE strength and improved balance.   Period Weeks   Status Partially Met   PT LONG TERM GOAL #3   Title Patient will increase six  minute walk test distance to >1000 for progression to community ambulator and improve gait ability   Period Weeks   Status Partially Met   PT LONG TERM GOAL #4   Title Patient will increase 10 meter walk test to >1.3ms as to improve gait speed for better community ambulation and to reduce fall risk   Period Weeks   Status Partially Met   PT LONG TERM GOAL #5   Title Patient will reduce timed up and go to <11 seconds to reduce fall risk and demonstrate improved transfer/gait ability   Period Weeks   Status Partially Met               Plan - 10/01/15 1112    Clinical Impression Statement CGA to SBA for safety with activities.  Uses to increase intensity and amplitude of movements throughout session.   Pt will benefit from skilled therapeutic  intervention in order to improve on the following deficits Abnormal gait;Decreased strength;Increased muscle spasms;Postural dysfunction;Difficulty walking;Decreased activity tolerance;Impaired sensation;Decreased balance;Decreased cognition;Pain;Decreased coordination;Decreased endurance   Rehab Potential Good   Clinical Impairments Affecting Rehab Potential left shoulder pain with certian positions   PT Frequency 4x / week   PT Duration 4 weeks   PT Treatment/Interventions ADLs/Self Care Home Management;Stair training;Gait training;Therapeutic exercise;Balance training;Neuromuscular re-education;Therapeutic activities   Consulted and Agree with Plan of Care Patient;Family member/caregiver        Problem List Patient Active Problem List   Diagnosis Date Noted  . HB (heart block) 07/26/2015  . Myocardial infarction ([place] 07/26/2015  . Parkinson's disease ([place] 04/07/2014  . Absolute anemia 03/06/2014  . Paroxysmal atrial fibrillation (HFox 03/06/2014  . Abnormal presence of protein in urine 03/06/2014  . Allergic rhinitis 03/04/2014  . Arteriosclerosis of coronary artery 03/04/2014  . Diabetes mellitus ([place] 03/04/2014  . Benign essential HTN 03/04/2014  . Acid reflux 03/04/2014  . HLD (hyperlipidemia) 03/04/2014  . Restless leg 03/04/2014  . Sensory neuropathy (HNorth Se[place] 03/04/2014   KAlanson Puls PT, DPT [place] KMinette HeadlandS 10/01/2015, 11:15 AM  CC[place] OrchardMAIN [facility]ICES [address] [place] [address]Phone: [phone]  Fax:  [phone] Name: Johnny WORRELMRN: [id number] of Birth: [birth year]

## 2015-10-02 ENCOUNTER — Ambulatory Visit: Payer: Medicare Other | Admitting: Physical Therapy

## 2015-10-02 ENCOUNTER — Encounter: Payer: Self-pay | Admitting: Physical Therapy

## 2015-10-02 ENCOUNTER — Ambulatory Visit: Payer: Medicare Other | Admitting: Speech Pathology

## 2015-10-02 ENCOUNTER — Encounter: Payer: Self-pay | Admitting: Speech Pathology

## 2015-10-02 DIAGNOSIS — R262 Difficulty in walking, not elsewhere classified: Secondary | ICD-10-CM

## 2015-10-02 DIAGNOSIS — R279 Unspecified lack of coordination: Secondary | ICD-10-CM

## 2015-10-02 DIAGNOSIS — R200 Anesthesia of skin: Secondary | ICD-10-CM

## 2015-10-02 DIAGNOSIS — M6281 Muscle weakness (generalized): Secondary | ICD-10-CM

## 2015-10-02 DIAGNOSIS — R490 Dysphonia: Secondary | ICD-10-CM | POA: Diagnosis not present

## 2015-10-02 DIAGNOSIS — R278 Other lack of coordination: Secondary | ICD-10-CM

## 2015-10-02 NOTE — Therapy (Signed)
[place] MAIN [facility] SERVICES [address] [place], [place], [address] Phone: [phone]   Fax:  [phone]  Physical Therapy Treatment  Patient Details  Name: Johnny Bowen MRN: [id number] Date of Birth: [birth year] Referring Provider: shah  Encounter Date: 10/02/2015      PT End of Session - 10/02/15 1108    Visit Number 9   Number of Visits 17   Date for PT Re-Evaluation 11-12-2015   Authorization Type g codes   PT Start Time 1000   PT Stop Time 1100   PT Time Calculation (min) 60 min   Equipment Utilized During Treatment Gait belt   Activity Tolerance Patient tolerated treatment well      Past Medical History  Diagnosis Date  . Allergic rhinitis   . Anemia   . Arthritis   . CAD (coronary artery disease)   . Diabetes mellitus with neurological manifestations ([place])   . Episodic atrial fibrillation ([place])   . HTN (hypertension)   . GERD (gastroesophageal reflux disease)   . Heart block   . History of lower GI bleeding   . HLD (hyperlipidemia)   . MI (myocardial infarction) ([place])   . Proteinuria   . RLS (restless legs syndrome)   . Shingles     Past Surgical History  Procedure Laterality Date  . Hernia repair    . Pacemaker placement    . Colonoscopy    . Esophagogastroduodenoscopy      There were no vitals filed for this visit.  Visit Diagnosis:  Difficulty walking  Muscle weakness  Decreased coordination  Numbness in feet      Subjective Assessment - 10/02/15 1107    Subjective Patient is doing his exercises.    Patient is accompained by: Family member   Pertinent History neuropathy, restless leg syndrome, ,Diabetes, high blood pressure, pace maker , a -fib, leg cramps, trouble with steps, rising from a chair,    Currently in Pain? No/denies       Min cueing needed to appropriately perform LSVT tasks with leg, hand, and head position. Decreased coordination demonstrated requiring consistent verbal  cueing to correct form. Cognitive understanding of task was delayed. Patient continues to demonstrate some in coordination of movement with select exercises such as rock and reach and stepping backwards. Patient responds well to verbal and tactile cues to correct form and technique. CGA to SBA for safety with activities. Uses to increase intensity and amplitude of movements throughout session                          PT Education - 10/02/15 1107    Education provided Yes   Education Details LSVT BIG   Person(s) Educated Patient   Methods Explanation   Comprehension Verbalized understanding             PT Long Term Goals - 09/27/15 1113    PT LONG TERM GOAL #1   Title Patient will be independent in home exercise program to improve strength/mobility for better functional independence with ADLs.   Period Weeks   Status Partially Met   PT LONG TERM GOAL #2   Title Patient (< 51 years old) will complete five times sit to stand test in < 10 seconds indicating an increased LE strength and improved balance.   Period Weeks   Status Partially Met   PT LONG TERM GOAL #3   Title Patient will increase six minute walk test  distance to >1000 for progression to community ambulator and improve gait ability   Period Weeks   Status Partially Met   PT LONG TERM GOAL #4   Title Patient will increase 10 meter walk test to >1.61ms as to improve gait speed for better community ambulation and to reduce fall risk   Period Weeks   Status Partially Met   PT LONG TERM GOAL #5   Title Patient will reduce timed up and go to <11 seconds to reduce fall risk and demonstrate improved transfer/gait ability   Period Weeks   Status Partially Met               Plan - 10/02/15 1108    Clinical Impression Statement Min cueing needed to appropriately perform LSVT tasks with leg, hand, and head position. Decreased coordination demonstrated requiring consistent verbal cueing to correct  form. Cognitive understanding of task was delayed. Patient continues to demonstrate some in coordination of movement with select exercises such as rock and reach and stepping backwards. Patient responds well to verbal and tactile cues to correct form and technique. CGA to SBA for safety with activities. Uses to increase intensity and amplitude of movements throughout session   Pt will benefit from skilled therapeutic intervention in order to improve on the following deficits Abnormal gait;Decreased strength;Increased muscle spasms;Postural dysfunction;Difficulty walking;Decreased activity tolerance;Impaired sensation;Decreased balance;Decreased cognition;Pain;Decreased coordination;Decreased endurance   Rehab Potential Good   Clinical Impairments Affecting Rehab Potential left shoulder pain with certian positions   PT Frequency 4x / week   PT Duration 4 weeks   PT Treatment/Interventions ADLs/Self Care Home Management;Stair training;Gait training;Therapeutic exercise;Balance training;Neuromuscular re-education;Therapeutic activities   Consulted and Agree with Plan of Care Patient;Family member/caregiver        Problem List Patient Active Problem List   Diagnosis Date Noted  . HB (heart block) 07/26/2015  . Myocardial infarction (HRandall 07/26/2015  . Parkinson's disease (HInterlaken 04/07/2014  . Absolute anemia 03/06/2014  . Paroxysmal atrial fibrillation (HGoochland 03/06/2014  . Abnormal presence of protein in urine 03/06/2014  . Allergic rhinitis 03/04/2014  . Arteriosclerosis of coronary artery 03/04/2014  . Diabetes mellitus (HPort Aransas 03/04/2014  . Benign essential HTN 03/04/2014  . Acid reflux 03/04/2014  . HLD (hyperlipidemia) 03/04/2014  . Restless leg 03/04/2014  . Sensory neuropathy (HComb[place] 03/04/2014  KAlanson Puls PT, DPT  [address] KMinette HeadlandS 10/02/2015, 11:10 AM  CBethelMAIN RPalomar [facility] CenterSERVICES [address]RMiddle Village [place]  [address]Phone: [phone]  Fax:  [phone] Name: RAADIN GAUTMRN: [id number] of Birth: [birth year]

## 2015-10-02 NOTE — Therapy (Signed)
[place] [facility] MAIN [facility] SERVICES [address] [place], [place], [address] Phone: [phone]   Fax:  [phone]  Speech Language Pathology Treatment  Patient Details  Name: Johnny Bowen MRN: [id number] Date of Birth: 1930/06/18 Referring Provider: Dr. Sherryll Burger  Encounter Date: 10/02/2015      End of Session - 10/02/15 0958    Visit Number 9   Number of Visits 17   Date for SLP Re-Evaluation 10/19/15   SLP Start Time 0900   SLP Stop Time  0950   SLP Time Calculation (min) 50 min   Activity Tolerance Patient tolerated treatment well      Past Medical History  Diagnosis Date  . Allergic rhinitis   . Anemia   . Arthritis   . CAD (coronary artery disease)   . Diabetes mellitus with neurological manifestations (HCC)   . Episodic atrial fibrillation (HCC)   . HTN (hypertension)   . GERD (gastroesophageal reflux disease)   . Heart block   . History of lower GI bleeding   . HLD (hyperlipidemia)   . MI (myocardial infarction) (HCC)   . Proteinuria   . RLS (restless legs syndrome)   . Shingles     Past Surgical History  Procedure Laterality Date  . Hernia repair    . Pacemaker placement    . Colonoscopy    . Esophagogastroduodenoscopy      There were no vitals filed for this visit.  Visit Diagnosis: Dysphonia      Subjective Assessment - 10/02/15 0957    Subjective The patient's wife agrees that the patient is articulating better as he learns to be loud.   Patient is accompained by: Family member   Currently in Pain? No/denies               ADULT SLP TREATMENT - 10/02/15 0001    General Information   Behavior/Cognition Alert;Cooperative;Pleasant mood;Hard of hearing   HPI Parkinson's disease   Treatment Provided   Treatment provided Cognitive-Linquistic   Pain Assessment   Pain Assessment No/denies pain   Cognitive-Linquistic Treatment   Treatment focused on Voice   Skilled Treatment Daily Task #1: Average 6  seconds, 85 dB, patient requiring increased cuing for quality. Daily Task 2: Highs: 15 high pitched "ah" given min-mod cues. Lows: 15 low pitched "ah" given min-mod cues. Daily task #3: Average 75 dB.  Hierarchal speech loudness drill: Generate sentences, 73 dB (requires fewer cues to slow rate and articulate clearly).  Homework: assignments given.  Off the cuff remarks: average 71 dB.   Assessment / Recommendations / Plan   Plan Continue with current plan of care   Progression Toward Goals   Progression toward goals Progressing toward goals          SLP Education - 10/02/15 0957    Education provided Yes   Education Details LSVT-LOUD   Person(s) Educated Patient;Spouse   Methods Explanation;Demonstration;Verbal cues;Handout   Comprehension Verbalized understanding;Returned demonstration;Verbal cues required;Need further instruction            SLP Long Term Goals - 09/12/15 1355    SLP LONG TERM GOAL #1   Title The patient will complete Daily Tasks (Maximum duration "ah", High/Lows, and Functional Phrases) at average loudness of 80 dB and with loud, good quality voice.    Time 4   Period Weeks   Status New   SLP LONG TERM GOAL #2   Title The patient will complete Hierarchal Speech Loudness reading drills (words/phrases,  sentences, and paragraph) at average 75 dB and with loud, good quality voice.     Time 4   Period Weeks   Status New   SLP LONG TERM GOAL #3   Title The patient will complete homework daily.   Time 4   Period Weeks   Status New   SLP LONG TERM GOAL #4   Title The patient will participate in conversation, maintaining average loudness of 75 dB and loud, good quality voice.   Time 4   Period Weeks   Status New          Plan - 10/02/15 8119    Clinical Impression Statement The patient is completing daily tasks and hierarchal speech drill tasks with loud, good quality voice given SLP cues for quality. Overall speech intelligibility remains much improved.  The patient is demonstrating emerging generalization into conversational speech.   Speech Therapy Frequency 4x / week   Duration 4 weeks   Treatment/Interventions Other (comment)  LSVT-LOUD   Potential to Achieve Goals Good   Potential Considerations Ability to learn/carryover information;Cooperation/participation level;Medical prognosis;Previous level of function;Severity of impairments;Family/community support   SLP Home Exercise Plan LSVT-LOUD daily homework   Consulted and Agree with Plan of Care Patient   Family Member Consulted Spouse        Problem List Patient Active Problem List   Diagnosis Date Noted  . HB (heart block) 07/26/2015  . Myocardial infarction (HCC) 07/26/2015  . Parkinson's disease (HCC) 04/07/2014  . Absolute anemia 03/06/2014  . Paroxysmal atrial fibrillation (HCC) 03/06/2014  . Abnormal presence of protein in urine 03/06/2014  . Allergic rhinitis 03/04/2014  . Arteriosclerosis of coronary artery 03/04/2014  . Diabetes mellitus (HCC) 03/04/2014  . Benign essential HTN 03/04/2014  . Acid reflux 03/04/2014  . HLD (hyperlipidemia) 03/04/2014  . Restless leg 03/04/2014  . Sensory neuropathy (HCC) 03/04/2014   Dollene Primrose, MS/CCC- SLP  Leandrew Koyanagi 10/02/2015, 9:59 AM  [place] [facility] MAIN [facility] SERVICES [address] [place], [place], [address] Phone: [phone]   Fax:  [phone]   Name: Johnny Bowen MRN: [id number] Date of Birth: 03-13-1930

## 2015-10-03 ENCOUNTER — Encounter: Payer: Self-pay | Admitting: Speech Pathology

## 2015-10-03 ENCOUNTER — Ambulatory Visit: Payer: Medicare Other | Admitting: Speech Pathology

## 2015-10-03 ENCOUNTER — Ambulatory Visit: Payer: Medicare Other | Admitting: Physical Therapy

## 2015-10-03 ENCOUNTER — Encounter: Payer: Self-pay | Admitting: Physical Therapy

## 2015-10-03 DIAGNOSIS — M6281 Muscle weakness (generalized): Secondary | ICD-10-CM

## 2015-10-03 DIAGNOSIS — R490 Dysphonia: Secondary | ICD-10-CM

## 2015-10-03 DIAGNOSIS — R200 Anesthesia of skin: Secondary | ICD-10-CM

## 2015-10-03 DIAGNOSIS — R278 Other lack of coordination: Secondary | ICD-10-CM

## 2015-10-03 DIAGNOSIS — R279 Unspecified lack of coordination: Secondary | ICD-10-CM

## 2015-10-03 DIAGNOSIS — R262 Difficulty in walking, not elsewhere classified: Secondary | ICD-10-CM

## 2015-10-03 NOTE — Therapy (Signed)
[place] MAIN [facility] SERVICES [address] [place], [place], [address] Phone: [phone]   Fax:  [phone]  Speech Language Pathology Treatment/Progress Note  Patient Details  Name: Johnny Bowen MRN: [id number] Date of Birth: 09-22-1929 Referring Provider: Dr. Manuella Ghazi  Encounter Date: 10/03/2015      End of Session - 10/03/15 1329    Visit Number 10   Number of Visits 17   Date for SLP Re-Evaluation 10/19/15   SLP Start Time 0900   SLP Stop Time  0955   SLP Time Calculation (min) 55 min   Activity Tolerance Patient tolerated treatment well      Past Medical History  Diagnosis Date  . Allergic rhinitis   . Anemia   . Arthritis   . CAD (coronary artery disease)   . Diabetes mellitus with neurological manifestations ([place])   . Episodic atrial fibrillation ([place])   . HTN (hypertension)   . GERD (gastroesophageal reflux disease)   . Heart block   . History of lower GI bleeding   . HLD (hyperlipidemia)   . MI (myocardial infarction) ([place])   . Proteinuria   . RLS (restless legs syndrome)   . Shingles     Past Surgical History  Procedure Laterality Date  . Hernia repair    . Pacemaker placement    . Colonoscopy    . Esophagogastroduodenoscopy      There were no vitals filed for this visit.  Visit Diagnosis: Dysphonia      Subjective Assessment - 10/03/15 1328    Subjective The patient's wife agrees that the patient is articulating better as he learns to be loud.   Patient is accompained by: Family member   Currently in Pain? No/denies               ADULT SLP TREATMENT - 10/03/15 0001    General Information   Behavior/Cognition Alert;Cooperative;Pleasant mood;Hard of hearing   HPI Parkinson's disease   Treatment Provided   Treatment provided Cognitive-Linquistic   Pain Assessment   Pain Assessment No/denies pain   Cognitive-Linquistic Treatment   Treatment focused on Voice   Skilled Treatment Daily Task  #1: Average 8 seconds, 82 dB. Daily Task 2: Highs: 15 high pitched "ah" given min-mod cues. Lows: 15 low pitched "ah" given min-mod cues. Daily task #3: Average 75 dB.  Hierarchal speech loudness drill: Generate sentences, 75 dB (requires fewer cues to slow rate and articulate clearly).  Homework: assignments given.  Off the cuff remarks: average 72 dB.   Assessment / Recommendations / Plan   Plan Continue with current plan of care;Goals updated   Progression Toward Goals   Progression toward goals Progressing toward goals          SLP Education - 10/03/15 1328    Education provided Yes   Education Details LSVT-LOUD   Person(s) Educated Patient;Spouse   Methods Explanation;Demonstration;Verbal cues;Handout   Comprehension Verbalized understanding;Returned demonstration;Verbal cues required;Need further instruction            SLP Long Term Goals - 10/03/15 1331    SLP LONG TERM GOAL #1   Title The patient will complete Daily Tasks (Maximum duration "ah", High/Lows, and Functional Phrases) at average loudness of 80 dB and with loud, good quality voice.    Time 4   Period Weeks   Status Partially Met   SLP LONG TERM GOAL #2   Title The patient will complete Hierarchal Speech Loudness reading drills (words/phrases, sentences, and paragraph)  at average 75 dB and with loud, good quality voice.     Time 4   Period Weeks   Status Partially Met   SLP LONG TERM GOAL #3   Title The patient will complete homework daily.   Time 4   Period Weeks   Status On-going   SLP LONG TERM GOAL #4   Title The patient will participate in conversation, maintaining average loudness of 75 dB and loud, good quality voice.   Time 4   Period Weeks   Status Partially Met          Plan - 10-22-2015 1329    Clinical Impression Statement The patient is completing daily tasks and hierarchal speech drill tasks with loud, good quality voice given SLP cues for quality and pitch change. Overall speech  intelligibility is much improved as well.  The patient is demonstrating generalization into conversational speech.   Speech Therapy Frequency 4x / week   Duration 4 weeks   Treatment/Interventions --  LSVT-LOUD   Potential to Achieve Goals Good   Potential Considerations Ability to learn/carryover information;Cooperation/participation level;Medical prognosis;Previous level of function;Severity of impairments;Family/community support   SLP Home Exercise Plan LSVT-LOUD daily homework   Consulted and Agree with Plan of Care Patient   Family Member Consulted Spouse          G-Codes - October 22, 2015 1332    Functional Assessment Tool Used LSVT-LOUD voice protocol, clinical judgment   Functional Limitations Voice   Voice Current Status (G9171) At least 20 percent but less than 40 percent impaired, limited or restricted   Voice Goal Status (G9172) At least 1 percent but less than 20 percent impaired, limited or restricted      Problem List Patient Active Problem List   Diagnosis Date Noted  . HB (heart block) 07/26/2015  . Myocardial infarction ([place]) 07/26/2015  . Parkinson's disease ([place]) 04/07/2014  . Absolute anemia 03/06/2014  . Paroxysmal atrial fibrillation ([place]) 03/06/2014  . Abnormal presence of protein in urine 03/06/2014  . Allergic rhinitis 03/04/2014  . Arteriosclerosis of coronary artery 03/04/2014  . Diabetes mellitus ([place]) 03/04/2014  . Benign essential HTN 03/04/2014  . Acid reflux 03/04/2014  . HLD (hyperlipidemia) 03/04/2014  . Restless leg 03/04/2014  . Sensory neuropathy ([place]) 03/04/2014   [name], MS/CCC- SLP  [name] October 22, 2015, 1:32 PM  [place] MAIN [facility] SERVICES [address] [place], [place], [address] Phone: [phone]   Fax:  [phone]   Name: Johnny Bowen MRN: [id number] Date of Birth: [birth year]

## 2015-10-03 NOTE — Therapy (Addendum)
[place] MAIN [facility] SERVICES [address] [place], [place], [address] Phone: [phone]   Fax:  [phone]  Physical Therapy Treatment  Patient Details  Name: Johnny Bowen MRN: [id number] Date of Birth: 1929-09-11 Referring Provider: shah  Encounter Date: 10/03/2015      PT End of Session - 10/03/15 0953    Visit Number 10   Number of Visits 17   Date for PT Re-Evaluation 2015-10-31   Authorization Type g codes   PT Start Time 1000   PT Stop Time 1100   PT Time Calculation (min) 60 min   Equipment Utilized During Treatment Gait belt   Activity Tolerance Patient tolerated treatment well      Past Medical History  Diagnosis Date  . Allergic rhinitis   . Anemia   . Arthritis   . CAD (coronary artery disease)   . Diabetes mellitus with neurological manifestations ([place])   . Episodic atrial fibrillation ([place])   . HTN (hypertension)   . GERD (gastroesophageal reflux disease)   . Heart block   . History of lower GI bleeding   . HLD (hyperlipidemia)   . MI (myocardial infarction) ([place])   . Proteinuria   . RLS (restless legs syndrome)   . Shingles     Past Surgical History  Procedure Laterality Date  . Hernia repair    . Pacemaker placement    . Colonoscopy    . Esophagogastroduodenoscopy      There were no vitals filed for this visit.  Visit Diagnosis:  Difficulty walking  Muscle weakness  Decreased coordination  Numbness in feet      Subjective Assessment - 10/03/15 1107    Subjective Patient is doing his exercises.    Patient is accompained by: Family member   Pertinent History neuropathy, restless leg syndrome, ,Diabetes, high blood pressure, pace maker , a -fib, leg cramps, trouble with steps, rising from a chair,    Currently in Pain? No/denies      Floor to ceiling x 10 reps, side to side 10 reps, step and reach forward x 10 reps, step and reach backwards x 10, step and reach sideways x 10 , Rock and  reach forward/backward x 10 , Rock and reach sideways x 10, functional tasks; 1 sit to stand 2. Coordination hand tasks and fine motor skills practice with coins, card shuffling, put on and off coat x 5 . Walking BIG with UE's swing Patient has unsteady stepping with several exercises but his motor control improve with practice. Patient needs occasional verbal cueing to improve posture and cueing to correctly perform exercises slowly, holding at end of range to increase motor firing of desired muscle to encourage fatigue                            PT Education - 10/03/15 0953    Education provided Yes   Education Details LSVT BIG   Person(s) Educated Patient   Methods Explanation   Comprehension Verbalized understanding             PT Long Term Goals - 09/27/15 1113    PT LONG TERM GOAL #1   Title Patient will be independent in home exercise program to improve strength/mobility for better functional independence with ADLs.   Period Weeks   Status Partially Met   PT LONG TERM GOAL #2   Title Patient (< 45 years old) will complete five times sit  to stand test in < 10 seconds indicating an increased LE strength and improved balance.   Period Weeks   Status Partially Met   PT LONG TERM GOAL #3   Title Patient will increase six minute walk test distance to >1000 for progression to community ambulator and improve gait ability   Period Weeks   Status Partially Met   PT LONG TERM GOAL #4   Title Patient will increase 10 meter walk test to >1.27ms as to improve gait speed for better community ambulation and to reduce fall risk   Period Weeks   Status Partially Met   PT LONG TERM GOAL #5   Title Patient will reduce timed up and go to <11 seconds to reduce fall risk and demonstrate improved transfer/gait ability   Period Weeks   Status Partially Met               Plan - 10/03/15 0954    Clinical Impression Statement  Patient has unsteady stepping with  several exercises but his motor control improve with practice. Patient needs occasional verbal cueing to improve posture and cueing to correctly perform exercises slowly, holding at end of range to increase motor firing of desired muscle to encourage fatigue   Pt will benefit from skilled therapeutic intervention in order to improve on the following deficits Abnormal gait;Decreased strength;Increased muscle spasms;Postural dysfunction;Difficulty walking;Decreased activity tolerance;Impaired sensation;Decreased balance;Decreased cognition;Pain;Decreased coordination;Decreased endurance   Rehab Potential Good   Clinical Impairments Affecting Rehab Potential left shoulder pain with certian positions   PT Frequency 4x / week   PT Duration 4 weeks   PT Treatment/Interventions ADLs/Self Care Home Management;Stair training;Gait training;Therapeutic exercise;Balance training;Neuromuscular re-education;Therapeutic activities   Consulted and Agree with Plan of Care Patient;Family member/caregiver        Problem List Patient Active Problem List   Diagnosis Date Noted  . HB (heart block) 07/26/2015  . Myocardial infarction ([place] 07/26/2015  . Parkinson's disease ([place] 04/07/2014  . Absolute anemia 03/06/2014  . Paroxysmal atrial fibrillation ([place] 03/06/2014  . Abnormal presence of protein in urine 03/06/2014  . Allergic rhinitis 03/04/2014  . Arteriosclerosis of coronary artery 03/04/2014  . Diabetes mellitus ([place] 03/04/2014  . Benign essential HTN 03/04/2014  . Acid reflux 03/04/2014  . HLD (hyperlipidemia) 03/04/2014  . Restless leg 03/04/2014  . Sensory neuropathy ([place] 03/04/2014   KAlanson Puls PT, DPT [place] KMinette HeadlandS 10/03/2015, 9:58 AM  [place] [facility] [address]RBrad[address] [place] [address]Phone: [phone]  Fax:  [phone] Name: Johnny BANGURAMRN: [id number] of Birth: [birth year]

## 2015-10-04 ENCOUNTER — Encounter: Payer: Self-pay | Admitting: Speech Pathology

## 2015-10-04 ENCOUNTER — Ambulatory Visit: Payer: Medicare Other | Admitting: Speech Pathology

## 2015-10-04 ENCOUNTER — Encounter: Payer: Self-pay | Admitting: Physical Therapy

## 2015-10-04 ENCOUNTER — Ambulatory Visit: Payer: Medicare Other | Admitting: Physical Therapy

## 2015-10-04 DIAGNOSIS — R490 Dysphonia: Secondary | ICD-10-CM

## 2015-10-04 DIAGNOSIS — M6281 Muscle weakness (generalized): Secondary | ICD-10-CM

## 2015-10-04 DIAGNOSIS — R278 Other lack of coordination: Secondary | ICD-10-CM

## 2015-10-04 DIAGNOSIS — R262 Difficulty in walking, not elsewhere classified: Secondary | ICD-10-CM

## 2015-10-04 DIAGNOSIS — R279 Unspecified lack of coordination: Secondary | ICD-10-CM

## 2015-10-04 DIAGNOSIS — R200 Anesthesia of skin: Secondary | ICD-10-CM

## 2015-10-04 NOTE — Therapy (Signed)
[name] [facility] SERVICES [address] [place], [place], [address] Phone: [phone]   Fax:  [phone]  Speech Language Pathology Treatment  Patient Details  Name: Johnny Bowen MRN: [id number] Date of Birth: [birth year] Referring Provider: Dr. Manuella Ghazi  Encounter Date: 10/04/2015      End of Session - 10/04/15 1349    Visit Number 11   Number of Visits 17   Date for SLP Re-Evaluation 10/19/15   SLP Start Time 39   SLP Stop Time  1149   SLP Time Calculation (min) 49 min   Activity Tolerance Patient tolerated treatment well      Past Medical History  Diagnosis Date  . Allergic rhinitis   . Anemia   . Arthritis   . CAD (coronary artery disease)   . Diabetes mellitus with neurological manifestations ([place])   . Episodic atrial fibrillation ([place])   . HTN (hypertension)   . GERD (gastroesophageal reflux disease)   . Heart block   . History of lower GI bleeding   . HLD (hyperlipidemia)   . MI (myocardial infarction) ([place])   . Proteinuria   . RLS (restless legs syndrome)   . Shingles     Past Surgical History  Procedure Laterality Date  . Hernia repair    . Pacemaker placement    . Colonoscopy    . Esophagogastroduodenoscopy      There were no vitals filed for this visit.  Visit Diagnosis: Dysphonia      Subjective Assessment - 10/04/15 1348    Subjective The patient's wife agrees that the patient is articulating better as he learns to be loud.   Patient is accompained by: Family member               ADULT SLP TREATMENT - 10/04/15 0001    General Information   Behavior/Cognition Alert;Cooperative;Pleasant mood;Hard of hearing   HPI Parkinson's disease   Treatment Provided   Treatment provided Cognitive-Linquistic   Pain Assessment   Pain Assessment No/denies pain   Cognitive-Linquistic Treatment   Treatment focused on Voice   Skilled Treatment Daily Task #1: Average 8 seconds, 82 dB. Daily Task 2:  Highs: 15 high pitched "ah" given min-mod cues. Lows: 15 low pitched "ah" given min-mod cues. Daily task #3: Average 75 dB.  Hierarchal speech loudness drill: Generate sentences, 75 dB (requires fewer cues to slow rate and articulate clearly).  Homework: assignments given.  Off the cuff remarks: average 72 dB.   Assessment / Recommendations / Plan   Plan Continue with current plan of care   Progression Toward Goals   Progression toward goals Progressing toward goals          SLP Education - 10/04/15 1348    Education provided Yes   Education Details LSVT-LOUD   Person(s) Educated Patient   Methods Explanation;Demonstration;Verbal cues;Handout   Comprehension Verbalized understanding;Returned demonstration;Verbal cues required;Need further instruction            SLP Long Term Goals - 10/03/15 1331    SLP LONG TERM GOAL #1   Title The patient will complete Daily Tasks (Maximum duration "ah", High/Lows, and Functional Phrases) at average loudness of 80 dB and with loud, good quality voice.    Time 4   Period Weeks   Status Partially Met   SLP LONG TERM GOAL #2   Title The patient will complete Hierarchal Speech Loudness reading drills (words/phrases, sentences, and paragraph) at average 75 dB and with loud, good  quality voice.     Time 4   Period Weeks   Status Partially Met   SLP LONG TERM GOAL #3   Title The patient will complete homework daily.   Time 4   Period Weeks   Status On-going   SLP LONG TERM GOAL #4   Title The patient will participate in conversation, maintaining average loudness of 75 dB and loud, good quality voice.   Time 4   Period Weeks   Status Partially Met          Plan - 10/04/15 1349    Clinical Impression Statement The patient is completing daily tasks and hierarchal speech drill tasks with loud, good quality voice given SLP cues for quality and pitch change. Overall speech intelligibility remains improved.  The patient is demonstrating  generalization into conversational speech.   Speech Therapy Frequency 4x / week   Duration 4 weeks   Treatment/Interventions Other (comment)  LSVT-LOUD   Potential to Achieve Goals Good   Potential Considerations Ability to learn/carryover information;Cooperation/participation level;Medical prognosis;Previous level of function;Severity of impairments;Family/community support   SLP Home Exercise Plan LSVT-LOUD daily homework   Consulted and Agree with Plan of Care Patient   Family Member Consulted Spouse          G-Codes - October 26, 2015 1332    Functional Assessment Tool Used LSVT-LOUD voice protocol, clinical judgment   Functional Limitations Voice   Voice Current Status (G9171) At least 20 percent but less than 40 percent impaired, limited or restricted   Voice Goal Status (G9172) At least 1 percent but less than 20 percent impaired, limited or restricted      Problem List Patient Active Problem List   Diagnosis Date Noted  . HB (heart block) 07/26/2015  . Myocardial infarction ([place]) 07/26/2015  . Parkinson's disease ([place]) 04/07/2014  . Absolute anemia 03/06/2014  . Paroxysmal atrial fibrillation ([place]) 03/06/2014  . Abnormal presence of protein in urine 03/06/2014  . Allergic rhinitis 03/04/2014  . Arteriosclerosis of coronary artery 03/04/2014  . Diabetes mellitus ([place]) 03/04/2014  . Benign essential HTN 03/04/2014  . Acid reflux 03/04/2014  . HLD (hyperlipidemia) 03/04/2014  . Restless leg 03/04/2014  . Sensory neuropathy ([place]) 03/04/2014   [name], MS/CCC- SLP  [name] 10/04/2015, 1:50 PM  [place] MAIN [facility] SERVICES [address] [place], [place], [address] Phone: [phone]   Fax:  [phone]   Name: Johnny Bowen MRN: [id number] Date of Birth: 1930/07/26

## 2015-10-04 NOTE — Therapy (Signed)
[place] MAIN [facility] SERVICES [address] [place], [place], [address] Phone: [phone]   Fax:  [phone]  Physical Therapy Treatment  Patient Details  Name: Johnny Bowen MRN: [id number] Date of Birth: October 20, 1929 Referring Provider: shah  Encounter Date: 10/04/2015      PT End of Session - 10/04/15 1107    Visit Number 11   Number of Visits 17   Date for PT Re-Evaluation Nov 15, 2015   Authorization Type g codes   PT Start Time 1000   PT Stop Time 1100   PT Time Calculation (min) 60 min   Equipment Utilized During Treatment Gait belt   Activity Tolerance Patient tolerated treatment well      Past Medical History  Diagnosis Date  . Allergic rhinitis   . Anemia   . Arthritis   . CAD (coronary artery disease)   . Diabetes mellitus with neurological manifestations ([place])   . Episodic atrial fibrillation ([place])   . HTN (hypertension)   . GERD (gastroesophageal reflux disease)   . Heart block   . History of lower GI bleeding   . HLD (hyperlipidemia)   . MI (myocardial infarction) ([place])   . Proteinuria   . RLS (restless legs syndrome)   . Shingles     Past Surgical History  Procedure Laterality Date  . Hernia repair    . Pacemaker placement    . Colonoscopy    . Esophagogastroduodenoscopy      There were no vitals filed for this visit.  Visit Diagnosis:  Difficulty walking  Muscle weakness  Decreased coordination  Numbness in feet      Subjective Assessment - 10/04/15 1107    Subjective Patient is doing his exercises.    Patient is accompained by: Family member   Pertinent History neuropathy, restless leg syndrome, ,Diabetes, high blood pressure, pace maker , a -fib, leg cramps, trouble with steps, rising from a chair,       Floor to ceiling x 10 reps, side to side 10 reps, step and reach forward x 10 reps, step and reach backwards x 10,  step and reach sideways x 10 , Rock and reach forward/backward x 10 ,  Rock and reach sideways x 10, functional tasks; 1 sit to stand  2. Coordination hand tasks and fine motor skills practice with coins, card shuffling. . Walking BIG with UE's swing  Patient has unsteady stepping with several exercises but his motor control improve with practice. Patient needs occasional verbal cueing to improve posture and cueing to correctly perform exercises slowly, holding at end of range to increase motor firing of desired muscle to encourage fatigue                           PT Education - 10/04/15 1107    Education provided Yes   Education Details LSVT BIG   Person(s) Educated Patient   Methods Explanation   Comprehension Verbalized understanding             PT Long Term Goals - 09/27/15 1113    PT LONG TERM GOAL #1   Title Patient will be independent in home exercise program to improve strength/mobility for better functional independence with ADLs.   Period Weeks   Status Partially Met   PT LONG TERM GOAL #2   Title Patient (< 39 years old) will complete five times sit to stand test in < 10 seconds indicating an increased LE  strength and improved balance.   Period Weeks   Status Partially Met   PT LONG TERM GOAL #3   Title Patient will increase six minute walk test distance to >1000 for progression to community ambulator and improve gait ability   Period Weeks   Status Partially Met   PT LONG TERM GOAL #4   Title Patient will increase 10 meter walk test to >1.25ms as to improve gait speed for better community ambulation and to reduce fall risk   Period Weeks   Status Partially Met   PT LONG TERM GOAL #5   Title Patient will reduce timed up and go to <11 seconds to reduce fall risk and demonstrate improved transfer/gait ability   Period Weeks   Status Partially Met               Plan - 10/04/15 1108    Clinical Impression Statement  Patient has difficulty with turning his head and rotating  trunk with weight shifting  exercises   Pt will benefit from skilled therapeutic intervention in order to improve on the following deficits Abnormal gait;Decreased strength;Increased muscle spasms;Postural dysfunction;Difficulty walking;Decreased activity tolerance;Impaired sensation;Decreased balance;Decreased cognition;Pain;Decreased coordination;Decreased endurance   Rehab Potential Good   Clinical Impairments Affecting Rehab Potential left shoulder pain with certian positions   PT Frequency 4x / week   PT Duration 4 weeks   PT Treatment/Interventions ADLs/Self Care Home Management;Stair training;Gait training;Therapeutic exercise;Balance training;Neuromuscular re-education;Therapeutic activities   Consulted and Agree with Plan of Care Patient;Family member/caregiver        Problem List Patient Active Problem List   Diagnosis Date Noted  . HB (heart block) 07/26/2015  . Myocardial infarction ([place] 07/26/2015  . Parkinson's disease ([place] 04/07/2014  . Absolute anemia 03/06/2014  . Paroxysmal atrial fibrillation (HGreentop 03/06/2014  . Abnormal presence of protein in urine 03/06/2014  . Allergic rhinitis 03/04/2014  . Arteriosclerosis of coronary artery 03/04/2014  . Diabetes mellitus ([place] 03/04/2014  . Benign essential HTN 03/04/2014  . Acid reflux 03/04/2014  . HLD (hyperlipidemia) 03/04/2014  . Restless leg 03/04/2014  . Sensory neuropathy (HMontour [place] 03/04/2014   KAlanson Puls PT, DPT [place] KMinette HeadlandS 10/04/2015, 11:10 AM  [place] [facility] [address] [address]Phone: [phone]  Fax:  [phone] Name: Johnny GOWDYMRN: [id number] of Birth: [birth year]

## 2015-10-08 ENCOUNTER — Ambulatory Visit: Payer: Medicare Other | Admitting: Physical Therapy

## 2015-10-08 ENCOUNTER — Ambulatory Visit: Payer: Medicare Other | Admitting: Speech Pathology

## 2015-10-08 ENCOUNTER — Encounter: Payer: Self-pay | Admitting: Speech Pathology

## 2015-10-08 ENCOUNTER — Encounter: Payer: Self-pay | Admitting: Physical Therapy

## 2015-10-08 DIAGNOSIS — M6281 Muscle weakness (generalized): Secondary | ICD-10-CM

## 2015-10-08 DIAGNOSIS — R490 Dysphonia: Secondary | ICD-10-CM

## 2015-10-08 DIAGNOSIS — R278 Other lack of coordination: Secondary | ICD-10-CM

## 2015-10-08 DIAGNOSIS — R200 Anesthesia of skin: Secondary | ICD-10-CM

## 2015-10-08 DIAGNOSIS — R262 Difficulty in walking, not elsewhere classified: Secondary | ICD-10-CM

## 2015-10-08 DIAGNOSIS — R279 Unspecified lack of coordination: Secondary | ICD-10-CM

## 2015-10-08 NOTE — Therapy (Signed)
[place] MAIN [facility] SERVICES [address] [place], [place], [address] Phone: [phone]   Fax:  [phone]  Physical Therapy Treatment  Patient Details  Name: Johnny Bowen MRN: [id number] Date of Birth: 05-Sep-1930 Referring Provider: shah  Encounter Date: 10/08/2015      PT End of Session - 10/08/15 0953    Visit Number 12   Number of Visits 17   Date for PT Re-Evaluation 11/07/2015   Authorization Type g codes   PT Start Time 1000   PT Stop Time 1055   PT Time Calculation (min) 55 min   Equipment Utilized During Treatment Gait belt   Activity Tolerance Patient tolerated treatment well      Past Medical History  Diagnosis Date  . Allergic rhinitis   . Anemia   . Arthritis   . CAD (coronary artery disease)   . Diabetes mellitus with neurological manifestations ([place])   . Episodic atrial fibrillation ([place])   . HTN (hypertension)   . GERD (gastroesophageal reflux disease)   . Heart block   . History of lower GI bleeding   . HLD (hyperlipidemia)   . MI (myocardial infarction) ([place])   . Proteinuria   . RLS (restless legs syndrome)   . Shingles     Past Surgical History  Procedure Laterality Date  . Hernia repair    . Pacemaker placement    . Colonoscopy    . Esophagogastroduodenoscopy      There were no vitals filed for this visit.  Visit Diagnosis:  Difficulty walking  Muscle weakness  Decreased coordination  Numbness in feet    standing hip abd with YTB x 20  side stepping left and right in parallel bars 10 feet x 3 standing on blue foam with cone reaching x 20 across midline step ups from floor to 6 inch stool x 20 bilateral sit to stand x 10 marching in parallel bars x 20 stepping pattern with weight shifting fwd/bwd x 10.                             PT Education - 10/08/15 [phone]    Education provided Yes   Education Details LSVT BIG             PT Long Term Goals -  09/27/15 1113    PT LONG TERM GOAL #1   Title Patient will be independent in home exercise program to improve strength/mobility for better functional independence with ADLs.   Period Weeks   Status Partially Met   PT LONG TERM GOAL #2   Title Patient (< 28 years old) will complete five times sit to stand test in < 10 seconds indicating an increased LE strength and improved balance.   Period Weeks   Status Partially Met   PT LONG TERM GOAL #3   Title Patient will increase six minute walk test distance to >1000 for progression to community ambulator and improve gait ability   Period Weeks   Status Partially Met   PT LONG TERM GOAL #4   Title Patient will increase 10 meter walk test to >1.2ms as to improve gait speed for better community ambulation and to reduce fall risk   Period Weeks   Status Partially Met   PT LONG TERM GOAL #5   Title Patient will reduce timed up and go to <11 seconds to reduce fall risk and demonstrate improved transfer/gait ability  Period Weeks   Status Partially Met               Plan - 10/08/15 0953    Clinical Impression Statement CGA to SBA for safety with activities.  Uses to increase intensity and amplitude of movements throughout session.   Pt will benefit from skilled therapeutic intervention in order to improve on the following deficits Abnormal gait;Decreased strength;Increased muscle spasms;Postural dysfunction;Difficulty walking;Decreased activity tolerance;Impaired sensation;Decreased balance;Decreased cognition;Pain;Decreased coordination;Decreased endurance   Rehab Potential Good   Clinical Impairments Affecting Rehab Potential left shoulder pain with certian positions   PT Frequency 4x / week   PT Duration 4 weeks   PT Treatment/Interventions ADLs/Self Care Home Management;Stair training;Gait training;Therapeutic exercise;Balance training;Neuromuscular re-education;Therapeutic activities   Consulted and Agree with Plan of Care  Patient;Family member/caregiver        Problem List Patient Active Problem List   Diagnosis Date Noted  . HB (heart block) 07/26/2015  . Myocardial infarction ([place]) 07/26/2015  . Parkinson's disease ([place]) 04/07/2014  . Absolute anemia 03/06/2014  . Paroxysmal atrial fibrillation ([place]) 03/06/2014  . Abnormal presence of protein in urine 03/06/2014  . Allergic rhinitis 03/04/2014  . Arteriosclerosis of coronary artery 03/04/2014  . Diabetes mellitus ([place]) 03/04/2014  . Benign essential HTN 03/04/2014  . Acid reflux 03/04/2014  . HLD (hyperlipidemia) 03/04/2014  . Restless leg 03/04/2014  . Sensory neuropathy [facility]) 03/04/2014    Alanson Puls 10/08/2015, 9:55 AM  [place] MAIN [facility] SERVICES [address] [place], [place], [address] Phone: [phone]   Fax:  [phone]  Name: Johnny Bowen MRN: [id number] Date of Birth: [birth year]

## 2015-10-08 NOTE — Therapy (Signed)
[place] MAIN [facility] SERVICES [address] [place], [place], [address] Phone: [phone]   Fax:  [phone]  Speech Language Pathology Treatment  Patient Details  Name: Johnny Bowen MRN: [id number] Date of Birth: 18-Mar-1930 Referring Provider: Dr. Manuella Ghazi  Encounter Date: 10/08/2015      End of Session - 10/08/15 1148    Visit Number 12   Number of Visits 17   Date for SLP Re-Evaluation 10/19/15   SLP Start Time 0900   SLP Stop Time  0955   SLP Time Calculation (min) 55 min   Activity Tolerance Patient tolerated treatment well      Past Medical History  Diagnosis Date  . Allergic rhinitis   . Anemia   . Arthritis   . CAD (coronary artery disease)   . Diabetes mellitus with neurological manifestations ([place])   . Episodic atrial fibrillation ([place])   . HTN (hypertension)   . GERD (gastroesophageal reflux disease)   . Heart block   . History of lower GI bleeding   . HLD (hyperlipidemia)   . MI (myocardial infarction) ([place])   . Proteinuria   . RLS (restless legs syndrome)   . Shingles     Past Surgical History  Procedure Laterality Date  . Hernia repair    . Pacemaker placement    . Colonoscopy    . Esophagogastroduodenoscopy      There were no vitals filed for this visit.  Visit Diagnosis: Dysphonia      Subjective Assessment - 10/08/15 1147    Subjective The patient's wife agrees that the patient is articulating better as he learns to be loud.   Patient is accompained by: Family member   Currently in Pain? No/denies               ADULT SLP TREATMENT - 10/08/15 0001    General Information   Behavior/Cognition Alert;Cooperative;Pleasant mood;Hard of hearing   HPI Parkinson's disease   Treatment Provided   Treatment provided Cognitive-Linquistic   Pain Assessment   Pain Assessment No/denies pain   Cognitive-Linquistic Treatment   Treatment focused on Voice   Skilled Treatment Daily Task #1: Average 8  seconds, 82 dB given mod-max cues for quality. Daily Task 2: Highs: 15 high pitched "ah" given min-mod cues. Lows: 15 low pitched "ah" given min-mod cues. Daily task #3: Average 75 dB.  Hierarchal speech loudness drill: Generate sentences, 75 dB (requires fewer cues to slow rate and articulate clearly).  Homework: assignments completed.  Off the cuff remarks: average 72 dB.   Assessment / Recommendations / Plan   Plan Continue with current plan of care   Progression Toward Goals   Progression toward goals Progressing toward goals          SLP Education - 10/08/15 1148    Education provided Yes   Education Details LSVT-LOUD   Person(s) Educated Patient;Spouse   Methods Explanation;Demonstration;Verbal cues;Handout   Comprehension Verbalized understanding;Returned demonstration;Verbal cues required;Need further instruction            SLP Long Term Goals - 10/03/15 1331    SLP LONG TERM GOAL #1   Title The patient will complete Daily Tasks (Maximum duration "ah", High/Lows, and Functional Phrases) at average loudness of 80 dB and with loud, good quality voice.    Time 4   Period Weeks   Status Partially Met   SLP LONG TERM GOAL #2   Title The patient will complete Hierarchal Speech Loudness reading drills (words/phrases,  sentences, and paragraph) at average 75 dB and with loud, good quality voice.     Time 4   Period Weeks   Status Partially Met   SLP LONG TERM GOAL #3   Title The patient will complete homework daily.   Time 4   Period Weeks   Status On-going   SLP LONG TERM GOAL #4   Title The patient will participate in conversation, maintaining average loudness of 75 dB and loud, good quality voice.   Time 4   Period Weeks   Status Partially Met          Plan - 10/08/15 1148    Clinical Impression Statement The patient is completing daily tasks and hierarchal speech drill tasks with loud, good quality voice given SLP cues for quality and pitch change. Overall speech  intelligibility remains improved.  The patient is demonstrating generalization into conversational speech.   Speech Therapy Frequency 4x / week   Duration 4 weeks   Treatment/Interventions Other (comment)  LSVT-LOUD   Potential to Achieve Goals Good   Potential Considerations Ability to learn/carryover information;Cooperation/participation level;Medical prognosis;Previous level of function;Severity of impairments;Family/community support   SLP Home Exercise Plan LSVT-LOUD daily homework   Consulted and Agree with Plan of Care Patient;Family member/caregiver   Family Member Consulted Spouse        Problem List Patient Active Problem List   Diagnosis Date Noted  . HB (heart block) 07/26/2015  . Myocardial infarction ([place]) 07/26/2015  . Parkinson's disease ([place]) 04/07/2014  . Absolute anemia 03/06/2014  . Paroxysmal atrial fibrillation ([place]) 03/06/2014  . Abnormal presence of protein in urine 03/06/2014  . Allergic rhinitis 03/04/2014  . Arteriosclerosis of coronary artery 03/04/2014  . Diabetes mellitus ([place]) 03/04/2014  . Benign essential HTN 03/04/2014  . Acid reflux 03/04/2014  . HLD (hyperlipidemia) 03/04/2014  . Restless leg 03/04/2014  . Sensory neuropathy ([place]) 03/04/2014   [name], MS/CCC- SLP  [name] 10/08/2015, 11:49 AM  [place] MAIN [facility] SERVICES [address] [place], [place], [address] Phone: [phone]   Fax:  [phone]   Name: Johnny Bowen MRN: [id number] Date of Birth: 1929-11-14

## 2015-10-09 ENCOUNTER — Ambulatory Visit: Payer: Medicare Other | Admitting: Speech Pathology

## 2015-10-09 ENCOUNTER — Encounter: Payer: Self-pay | Admitting: Physical Therapy

## 2015-10-09 ENCOUNTER — Ambulatory Visit: Payer: Medicare Other | Admitting: Physical Therapy

## 2015-10-09 ENCOUNTER — Encounter: Payer: Self-pay | Admitting: Speech Pathology

## 2015-10-09 DIAGNOSIS — R490 Dysphonia: Secondary | ICD-10-CM | POA: Diagnosis not present

## 2015-10-09 DIAGNOSIS — R278 Other lack of coordination: Secondary | ICD-10-CM

## 2015-10-09 DIAGNOSIS — M6281 Muscle weakness (generalized): Secondary | ICD-10-CM

## 2015-10-09 DIAGNOSIS — R279 Unspecified lack of coordination: Secondary | ICD-10-CM

## 2015-10-09 DIAGNOSIS — R262 Difficulty in walking, not elsewhere classified: Secondary | ICD-10-CM

## 2015-10-09 DIAGNOSIS — R200 Anesthesia of skin: Secondary | ICD-10-CM

## 2015-10-09 NOTE — Therapy (Signed)
[place] [facility] MAIN [facility] SERVICES [address] [place], [place], [address] Phone: [phone]   Fax:  [phone]  Speech Language Pathology Treatment  Patient Details  Name: Johnny Bowen MRN: [id number] Date of Birth: 1929/12/17 Referring Provider: Dr. Sherryll Burger  Encounter Date: 10/09/2015      End of Session - 10/09/15 0953    Visit Number 13   Number of Visits 17   Date for SLP Re-Evaluation 10/19/15   SLP Start Time 0854   SLP Stop Time  0945   SLP Time Calculation (min) 51 min   Activity Tolerance Patient tolerated treatment well      Past Medical History  Diagnosis Date  . Allergic rhinitis   . Anemia   . Arthritis   . CAD (coronary artery disease)   . Diabetes mellitus with neurological manifestations (HCC)   . Episodic atrial fibrillation (HCC)   . HTN (hypertension)   . GERD (gastroesophageal reflux disease)   . Heart block   . History of lower GI bleeding   . HLD (hyperlipidemia)   . MI (myocardial infarction) (HCC)   . Proteinuria   . RLS (restless legs syndrome)   . Shingles     Past Surgical History  Procedure Laterality Date  . Hernia repair    . Pacemaker placement    . Colonoscopy    . Esophagogastroduodenoscopy      There were no vitals filed for this visit.  Visit Diagnosis: Dysphonia      Subjective Assessment - 10/09/15 0952    Subjective The patient's wife agrees that the patient is articulating better as he learns to be loud.   Patient is accompained by: Family member   Currently in Pain? No/denies               ADULT SLP TREATMENT - 10/09/15 0001    General Information   Behavior/Cognition Alert;Cooperative;Pleasant mood;Hard of hearing   HPI Parkinson's disease   Treatment Provided   Treatment provided Cognitive-Linquistic   Pain Assessment   Pain Assessment No/denies pain   Cognitive-Linquistic Treatment   Treatment focused on Voice   Skilled Treatment Daily Task #1: Average 9  seconds, 82 dB given mod-max cues for quality. Daily Task 2: Highs: 15 high pitched "ah" given min-mod cues. Lows: 15 low pitched "ah" given min-mod cues. Daily task #3: Average 75 dB.  Hierarchal speech loudness drill: Generate sentences, 75 dB (requires no cues to slow rate and articulate clearly).  Homework: assignments completed.  Off the cuff remarks: average 74 dB.   Assessment / Recommendations / Plan   Plan Continue with current plan of care   Progression Toward Goals   Progression toward goals Progressing toward goals          SLP Education - 10/09/15 0953    Education provided Yes   Education Details LSVT-LOUD   Person(s) Educated Patient;Spouse   Methods Explanation;Demonstration;Verbal cues;Handout   Comprehension Verbalized understanding;Returned demonstration;Verbal cues required;Need further instruction            SLP Long Term Goals - 10/03/15 1331    SLP LONG TERM GOAL #1   Title The patient will complete Daily Tasks (Maximum duration "ah", High/Lows, and Functional Phrases) at average loudness of 80 dB and with loud, good quality voice.    Time 4   Period Weeks   Status Partially Met   SLP LONG TERM GOAL #2   Title The patient will complete Hierarchal Speech Loudness reading drills (words/phrases,  sentences, and paragraph) at average 75 dB and with loud, good quality voice.     Time 4   Period Weeks   Status Partially Met   SLP LONG TERM GOAL #3   Title The patient will complete homework daily.   Time 4   Period Weeks   Status On-going   SLP LONG TERM GOAL #4   Title The patient will participate in conversation, maintaining average loudness of 75 dB and loud, good quality voice.   Time 4   Period Weeks   Status Partially Met          Plan - 10/09/15 0954    Clinical Impression Statement The patient is completing daily tasks and hierarchal speech drill tasks with loud, good quality voice given SLP cues for quality and pitch change. Overall speech  intelligibility remains improved.  The patient is demonstrating generalization into conversational speech.   Speech Therapy Frequency 4x / week   Duration 4 weeks   Treatment/Interventions Other (comment)  LSVT-LOUD   Potential to Achieve Goals Good   Potential Considerations Ability to learn/carryover information;Cooperation/participation level;Medical prognosis;Previous level of function;Severity of impairments;Family/community support   SLP Home Exercise Plan LSVT-LOUD daily homework   Consulted and Agree with Plan of Care Patient;Family member/caregiver   Family Member Consulted Spouse        Problem List Patient Active Problem List   Diagnosis Date Noted  . HB (heart block) 07/26/2015  . Myocardial infarction ([place]) 07/26/2015  . Parkinson's disease ([place]) 04/07/2014  . Absolute anemia 03/06/2014  . Paroxysmal atrial fibrillation ([place]) 03/06/2014  . Abnormal presence of protein in urine 03/06/2014  . Allergic rhinitis 03/04/2014  . Arteriosclerosis of coronary artery 03/04/2014  . Diabetes mellitus ([place]) 03/04/2014  . Benign essential HTN 03/04/2014  . Acid reflux 03/04/2014  . HLD (hyperlipidemia) 03/04/2014  . Restless leg 03/04/2014  . Sensory neuropathy ([place]) 03/04/2014   [name], MS/CCC- SLP  [name] 10/09/2015, 9:55 AM  [place] MAIN [facility] SERVICES [address] [place], [place], [address] Phone: [phone]   Fax:  [phone]   Name: DRAKO MAESE MRN: [id number] Date of Birth: 03/19/1930

## 2015-10-09 NOTE — Therapy (Signed)
[place] MAIN [facility] SERVICES [address] [place], [place], [address] Phone: [phone]   Fax:  [phone]  Physical Therapy Treatment  Patient Details  Name: Johnny Bowen MRN: [id number] Date of Birth: Nov 24, 1929 Referring Provider: shah  Encounter Date: 10/09/2015      PT End of Session - 10/09/15 1101    Visit Number 13   Number of Visits 17   Date for PT Re-Evaluation 29-Oct-2015   Authorization Type g codes   PT Start Time 1000   PT Stop Time 1100   PT Time Calculation (min) 60 min   Equipment Utilized During Treatment Gait belt   Activity Tolerance Patient tolerated treatment well      Past Medical History  Diagnosis Date  . Allergic rhinitis   . Anemia   . Arthritis   . CAD (coronary artery disease)   . Diabetes mellitus with neurological manifestations ([place])   . Episodic atrial fibrillation ([place])   . HTN (hypertension)   . GERD (gastroesophageal reflux disease)   . Heart block   . History of lower GI bleeding   . HLD (hyperlipidemia)   . MI (myocardial infarction) ([place])   . Proteinuria   . RLS (restless legs syndrome)   . Shingles     Past Surgical History  Procedure Laterality Date  . Hernia repair    . Pacemaker placement    . Colonoscopy    . Esophagogastroduodenoscopy      There were no vitals filed for this visit.  Visit Diagnosis:  Difficulty walking  Muscle weakness  Decreased coordination  Numbness in feet      Subjective Assessment - 10/09/15 1042    Subjective Patient is doing his exercises.    Patient is accompained by: Family member   Pertinent History neuropathy, restless leg syndrome, ,Diabetes, high blood pressure, pace maker , a -fib, leg cramps, trouble with steps, rising from a chair,       Floor to ceiling x 10 reps, side to side 10 reps, step and reach forward x 10 reps, step and reach backwards x 10,  step and reach sideways x 10 , Rock and reach forward/backward x 10 ,  Rock and reach sideways x 10, functional tasks; 1 sit to stand  2. Coordination hand tasks and fine motor skills practice with coins, card shuffling. . Walking BIG with UE's swing  Patient has unsteady stepping with several exercises but his motor control improve with practice. Patients performance improves with practice and she uses UE to help support and for balance.Patient needs occasional verbal cueing to improve posture and cueing to correctly perform exercises slowly, holding at end of range to increase motor firing of desired muscle to encourage fatigue                            PT Education - 10/09/15 1042    Education provided Yes   Education Details LSVT BIG   Person(s) Educated Patient   Methods Explanation             PT Long Term Goals - 09/27/15 1113    PT LONG TERM GOAL #1   Title Patient will be independent in home exercise program to improve strength/mobility for better functional independence with ADLs.   Period Weeks   Status Partially Met   PT LONG TERM GOAL #2   Title Patient (< 25 years old) will complete five times sit to  stand test in < 10 seconds indicating an increased LE strength and improved balance.   Period Weeks   Status Partially Met   PT LONG TERM GOAL #3   Title Patient will increase six minute walk test distance to >1000 for progression to community ambulator and improve gait ability   Period Weeks   Status Partially Met   PT LONG TERM GOAL #4   Title Patient will increase 10 meter walk test to >1.11ms as to improve gait speed for better community ambulation and to reduce fall risk   Period Weeks   Status Partially Met   PT LONG TERM GOAL #5   Title Patient will reduce timed up and go to <11 seconds to reduce fall risk and demonstrate improved transfer/gait ability   Period Weeks   Status Partially Met               Plan - 10/09/15 1103    Clinical Impression Statement Continues to have balance deficits  typical with diagnosis. Patient performs intermediate level exercises without pain behaviors and needs verbal cuing for postural alignment and head positioning   Pt will benefit from skilled therapeutic intervention in order to improve on the following deficits Abnormal gait;Decreased strength;Increased muscle spasms;Postural dysfunction;Difficulty walking;Decreased activity tolerance;Impaired sensation;Decreased balance;Decreased cognition;Pain;Decreased coordination;Decreased endurance   Rehab Potential Good   Clinical Impairments Affecting Rehab Potential left shoulder pain with certian positions   PT Frequency 4x / week   PT Duration 4 weeks   PT Treatment/Interventions ADLs/Self Care Home Management;Stair training;Gait training;Therapeutic exercise;Balance training;Neuromuscular re-education;Therapeutic activities   Consulted and Agree with Plan of Care Patient;Family member/caregiver        Problem List Patient Active Problem List   Diagnosis Date Noted  . HB (heart block) 07/26/2015  . Myocardial infarction ([place] 07/26/2015  . Parkinson's disease ([place] 04/07/2014  . Absolute anemia 03/06/2014  . Paroxysmal atrial fibrillation ([place] 03/06/2014  . Abnormal presence of protein in urine 03/06/2014  . Allergic rhinitis 03/04/2014  . Arteriosclerosis of coronary artery 03/04/2014  . Diabetes mellitus ([place] 03/04/2014  . Benign essential HTN 03/04/2014  . Acid reflux 03/04/2014  . HLD (hyperlipidemia) 03/04/2014  . Restless leg 03/04/2014  . Sensory neuropathy ([place] 03/04/2014  KAlanson Puls PT, DPT  [place] KMinette HeadlandS 10/09/2015, 11:04 AM  [place] [facility] [address] [address]Phone: [phone]  Fax:  [phone] Name: Johnny KARBOWSKIMRN: [id number] of Birth: 106/23/1931

## 2015-10-10 ENCOUNTER — Ambulatory Visit: Payer: Medicare Other | Admitting: Physical Therapy

## 2015-10-10 ENCOUNTER — Encounter: Payer: Self-pay | Admitting: Physical Therapy

## 2015-10-10 ENCOUNTER — Ambulatory Visit: Payer: Medicare Other | Attending: Neurology | Admitting: Speech Pathology

## 2015-10-10 ENCOUNTER — Encounter: Payer: Self-pay | Admitting: Speech Pathology

## 2015-10-10 DIAGNOSIS — R490 Dysphonia: Secondary | ICD-10-CM | POA: Insufficient documentation

## 2015-10-10 DIAGNOSIS — R262 Difficulty in walking, not elsewhere classified: Secondary | ICD-10-CM

## 2015-10-10 DIAGNOSIS — M6281 Muscle weakness (generalized): Secondary | ICD-10-CM | POA: Diagnosis present

## 2015-10-10 DIAGNOSIS — R208 Other disturbances of skin sensation: Secondary | ICD-10-CM | POA: Insufficient documentation

## 2015-10-10 DIAGNOSIS — R279 Unspecified lack of coordination: Secondary | ICD-10-CM | POA: Insufficient documentation

## 2015-10-10 DIAGNOSIS — R278 Other lack of coordination: Secondary | ICD-10-CM

## 2015-10-10 DIAGNOSIS — R200 Anesthesia of skin: Secondary | ICD-10-CM

## 2015-10-10 NOTE — Therapy (Signed)
[place] MAIN [facility] SERVICES [address] [place], [place], [address] Phone: [phone]   Fax:  [phone]  Physical Therapy Treatment  Patient Details  Name: Johnny Bowen MRN: [id number] Date of Birth: 1929/12/05 Referring Provider: shah  Encounter Date: 10/10/2015      PT End of Session - 10/10/15 1043    Visit Number 14   Number of Visits 17   Date for PT Re-Evaluation Oct 30, 2015   Authorization Type g codes   PT Start Time 1000   PT Stop Time 1100   PT Time Calculation (min) 60 min   Equipment Utilized During Treatment Gait belt   Activity Tolerance Patient tolerated treatment well      Past Medical History  Diagnosis Date  . Allergic rhinitis   . Anemia   . Arthritis   . CAD (coronary artery disease)   . Diabetes mellitus with neurological manifestations ([place])   . Episodic atrial fibrillation ([place])   . HTN (hypertension)   . GERD (gastroesophageal reflux disease)   . Heart block   . History of lower GI bleeding   . HLD (hyperlipidemia)   . MI (myocardial infarction) ([place])   . Proteinuria   . RLS (restless legs syndrome)   . Shingles     Past Surgical History  Procedure Laterality Date  . Hernia repair    . Pacemaker placement    . Colonoscopy    . Esophagogastroduodenoscopy      There were no vitals filed for this visit.  Visit Diagnosis:  Difficulty walking  Muscle weakness  Decreased coordination  Numbness in feet      Subjective Assessment - 10/10/15 1008    Subjective Patient is doing his exercises.    Patient is accompained by: Family member   Pertinent History neuropathy, restless leg syndrome, ,Diabetes, high blood pressure, pace maker , a -fib, leg cramps, trouble with steps, rising from a chair,         Floor to ceiling x 10 reps, side to side 10 reps, step and reach forward x 10 reps, step and reach backwards x 10,  step and reach sideways x 10 , Rock and reach forward/backward x 10 ,  Rock and reach sideways x 10, functional tasks; 1 sit to stand  2. Coordination hand tasks and fine motor skills practice with coins, card shuffling. . Walking BIG with UE's swing. Patients performance improves with practice and  uses UE to help support and for balance. CGA to SBA for safety with activities.  Uses to increase intensity and amplitude of movements throughout session.  Patient has unsteady stepping with several exercises but his motor control improve with practice. Patient needs occasional verbal cueing to improve posture and cueing to correctly perform exercises slowly, holding at end of range to increase motor firing of desired muscle to encourage fatigue                          PT Education - 10/10/15 1008    Education provided (p) Yes   Person(s) Educated (p) Patient   Methods (p) Explanation   Comprehension (p) Verbalized understanding             PT Long Term Goals - 09/27/15 1113    PT LONG TERM GOAL #1   Title Patient will be independent in home exercise program to improve strength/mobility for better functional independence with ADLs.   Period Weeks   Status Partially  Met   PT LONG TERM GOAL #2   Title Patient (< 75 years old) will complete five times sit to stand test in < 10 seconds indicating an increased LE strength and improved balance.   Period Weeks   Status Partially Met   PT LONG TERM GOAL #3   Title Patient will increase six minute walk test distance to >1000 for progression to community ambulator and improve gait ability   Period Weeks   Status Partially Met   PT LONG TERM GOAL #4   Title Patient will increase 10 meter walk test to >1.18ms as to improve gait speed for better community ambulation and to reduce fall risk   Period Weeks   Status Partially Met   PT LONG TERM GOAL #5   Title Patient will reduce timed up and go to <11 seconds to reduce fall risk and demonstrate improved transfer/gait ability   Period Weeks    Status Partially Met               Plan - 10/10/15 1043    Clinical Impression Statement Patient has difficulty with finishing BIG and needs extra cuing to perform exercises with correct amplitude and speed.   Pt will benefit from skilled therapeutic intervention in order to improve on the following deficits Abnormal gait;Decreased strength;Increased muscle spasms;Postural dysfunction;Difficulty walking;Decreased activity tolerance;Impaired sensation;Decreased balance;Decreased cognition;Pain;Decreased coordination;Decreased endurance   Rehab Potential Good   Clinical Impairments Affecting Rehab Potential left shoulder pain with certian positions   PT Frequency 4x / week   PT Duration 4 weeks   PT Treatment/Interventions ADLs/Self Care Home Management;Stair training;Gait training;Therapeutic exercise;Balance training;Neuromuscular re-education;Therapeutic activities   Consulted and Agree with Plan of Care Patient;Family member/caregiver        Problem List Patient Active Problem List   Diagnosis Date Noted  . HB (heart block) 07/26/2015  . Myocardial infarction ([place] 07/26/2015  . Parkinson's disease ([place] 04/07/2014  . Absolute anemia 03/06/2014  . Paroxysmal atrial fibrillation ([place] 03/06/2014  . Abnormal presence of protein in urine 03/06/2014  . Allergic rhinitis 03/04/2014  . Arteriosclerosis of coronary artery 03/04/2014  . Diabetes mellitus ([place] 03/04/2014  . Benign essential HTN 03/04/2014  . Acid reflux 03/04/2014  . HLD (hyperlipidemia) 03/04/2014  . Restless leg 03/04/2014  . Sensory neuropathy ([facility] 03/04/2014    MAlanson Puls2/09/2015, 10:48 AM  CFort Myers ShoresMAIN [facility]apt[facility] WomenSERVICES [address]Blank[address] [place] [address]Phone: [phone]  Fax:  [phone] Name: Johnny SIBALMRN: [id number] of Birth: [birth year]

## 2015-10-10 NOTE — Therapy (Signed)
[place] [facility] MAIN [facility] SERVICES [address] [place], [place], [address] Phone: [phone]   Fax:  [phone]  Speech Language Pathology Treatment  Patient Details  Name: Johnny Bowen MRN: [id number] Date of Birth: 1930-07-04 Referring Provider: Dr. Sherryll Burger  Encounter Date: 10/10/2015      End of Session - 10/10/15 1009    Visit Number 14   Number of Visits 17   Date for SLP Re-Evaluation 10/19/15   SLP Start Time 0900   SLP Stop Time  0953   SLP Time Calculation (min) 53 min   Activity Tolerance Patient tolerated treatment well      Past Medical History  Diagnosis Date  . Allergic rhinitis   . Anemia   . Arthritis   . CAD (coronary artery disease)   . Diabetes mellitus with neurological manifestations (HCC)   . Episodic atrial fibrillation (HCC)   . HTN (hypertension)   . GERD (gastroesophageal reflux disease)   . Heart block   . History of lower GI bleeding   . HLD (hyperlipidemia)   . MI (myocardial infarction) (HCC)   . Proteinuria   . RLS (restless legs syndrome)   . Shingles     Past Surgical History  Procedure Laterality Date  . Hernia repair    . Pacemaker placement    . Colonoscopy    . Esophagogastroduodenoscopy      There were no vitals filed for this visit.  Visit Diagnosis: Dysphonia      Subjective Assessment - 10/10/15 1009    Subjective The patient's wife agrees that the patient is articulating better as he learns to be loud.   Patient is accompained by: Family member               ADULT SLP TREATMENT - 10/10/15 0001    General Information   Behavior/Cognition Alert;Cooperative;Pleasant mood;Hard of hearing   HPI Parkinson's disease   Treatment Provided   Treatment provided Cognitive-Linquistic   Pain Assessment   Pain Assessment No/denies pain   Cognitive-Linquistic Treatment   Treatment focused on Voice   Skilled Treatment Daily Task #1: Average 9 seconds, 82 dB given mod-max  cues for quality. Daily Task 2: Highs: 15 high pitched "ah" given mod-max cues. Lows: 15 low pitched "ah" given mod cues. Daily task #3: Average 75 dB.  Hierarchal speech loudness drill: Generate sentences, 75 dB (requires no cues to slow rate and articulate clearly).  Homework: assignments completed.  Off the cuff remarks: average 74 dB.   Assessment / Recommendations / Plan   Plan Continue with current plan of care   Progression Toward Goals   Progression toward goals Progressing toward goals          SLP Education - 10/10/15 1009    Education provided Yes   Education Details LSVT-LOUD   Person(s) Educated Patient;Spouse   Methods Explanation;Demonstration;Verbal cues;Handout   Comprehension Verbalized understanding;Returned demonstration;Verbal cues required;Need further instruction            SLP Long Term Goals - 10/03/15 1331    SLP LONG TERM GOAL #1   Title The patient will complete Daily Tasks (Maximum duration "ah", High/Lows, and Functional Phrases) at average loudness of 80 dB and with loud, good quality voice.    Time 4   Period Weeks   Status Partially Met   SLP LONG TERM GOAL #2   Title The patient will complete Hierarchal Speech Loudness reading drills (words/phrases, sentences, and paragraph) at average 75  dB and with loud, good quality voice.     Time 4   Period Weeks   Status Partially Met   SLP LONG TERM GOAL #3   Title The patient will complete homework daily.   Time 4   Period Weeks   Status On-going   SLP LONG TERM GOAL #4   Title The patient will participate in conversation, maintaining average loudness of 75 dB and loud, good quality voice.   Time 4   Period Weeks   Status Partially Met          Plan - 10/10/15 1010    Clinical Impression Statement The patient is completing daily tasks and hierarchal speech drill tasks with loud, good quality voice given SLP cues for quality of sustained vowel and pitch change. Overall speech intelligibility  remains improved.  The patient is demonstrating generalization into conversational speech.   Speech Therapy Frequency 4x / week   Duration 4 weeks   Treatment/Interventions Other (comment)  LSVT-LOUD   Potential to Achieve Goals Good   Potential Considerations Ability to learn/carryover information;Cooperation/participation level;Medical prognosis;Previous level of function;Severity of impairments;Family/community support   SLP Home Exercise Plan LSVT-LOUD daily homework   Consulted and Agree with Plan of Care Patient;Family member/caregiver   Family Member Consulted Spouse        Problem List Patient Active Problem List   Diagnosis Date Noted  . HB (heart block) 07/26/2015  . Myocardial infarction ([place]) 07/26/2015  . Parkinson's disease ([place]) 04/07/2014  . Absolute anemia 03/06/2014  . Paroxysmal atrial fibrillation ([place]) 03/06/2014  . Abnormal presence of protein in urine 03/06/2014  . Allergic rhinitis 03/04/2014  . Arteriosclerosis of coronary artery 03/04/2014  . Diabetes mellitus ([place]) 03/04/2014  . Benign essential HTN 03/04/2014  . Acid reflux 03/04/2014  . HLD (hyperlipidemia) 03/04/2014  . Restless leg 03/04/2014  . Sensory neuropathy ([place]) 03/04/2014   [name], MS/CCC- SLP  [name] 10/10/2015, 10:11 AM  [place] MAIN [facility] SERVICES [address] [place], [place], [address] Phone: [phone]   Fax:  [phone]   Name: Johnny Bowen MRN: [id number] Date of Birth: 11-03-1929

## 2015-10-11 ENCOUNTER — Ambulatory Visit: Payer: Medicare Other | Admitting: Speech Pathology

## 2015-10-11 ENCOUNTER — Ambulatory Visit: Payer: Medicare Other | Admitting: Physical Therapy

## 2015-10-11 DIAGNOSIS — R200 Anesthesia of skin: Secondary | ICD-10-CM

## 2015-10-11 DIAGNOSIS — R490 Dysphonia: Secondary | ICD-10-CM

## 2015-10-11 DIAGNOSIS — R278 Other lack of coordination: Secondary | ICD-10-CM

## 2015-10-11 DIAGNOSIS — M6281 Muscle weakness (generalized): Secondary | ICD-10-CM

## 2015-10-11 DIAGNOSIS — R262 Difficulty in walking, not elsewhere classified: Secondary | ICD-10-CM

## 2015-10-11 DIAGNOSIS — R279 Unspecified lack of coordination: Secondary | ICD-10-CM

## 2015-10-11 NOTE — Therapy (Signed)
[place] MAIN [facility] SERVICES [address] [place], [place], [address] Phone: [phone]   Fax:  [phone]  Physical Therapy Treatment  Patient Details  Name: Johnny Bowen MRN: [id number] Date of Birth: Oct 21, 1929 Referring Provider: shah  Encounter Date: 10/11/2015      PT End of Session - 10/10/15 1043    Visit Number 14   Number of Visits 17   Date for PT Re-Evaluation 10/25/15   Authorization Type g codes   PT Start Time 1000   PT Stop Time 1100   PT Time Calculation (min) 60 min   Equipment Utilized During Treatment Gait belt   Activity Tolerance Patient tolerated treatment well      Past Medical History  Diagnosis Date  . Allergic rhinitis   . Anemia   . Arthritis   . CAD (coronary artery disease)   . Diabetes mellitus with neurological manifestations ([place])   . Episodic atrial fibrillation ([place])   . HTN (hypertension)   . GERD (gastroesophageal reflux disease)   . Heart block   . History of lower GI bleeding   . HLD (hyperlipidemia)   . MI (myocardial infarction) ([place])   . Proteinuria   . RLS (restless legs syndrome)   . Shingles     Past Surgical History  Procedure Laterality Date  . Hernia repair    . Pacemaker placement    . Colonoscopy    . Esophagogastroduodenoscopy      There were no vitals filed for this visit.  Visit Diagnosis:  Difficulty walking  Muscle weakness  Decreased coordination  Numbness in feet      Subjective Assessment - 10/11/15 1115    Subjective Patient is doing his exercises.    Patient is accompained by: Family member   Pertinent History neuropathy, restless leg syndrome, ,Diabetes, high blood pressure, pace maker , a -fib, leg cramps, trouble with steps, rising from a chair,    Currently in Pain? No/denies        Floor to ceiling x 10 reps, side to side 10 reps, step and reach forward x 10 reps, step and reach backwards x 10,  step and reach sideways x 10 , Rock  and reach forward/backward x 10 , Rock and reach sideways x 10, functional tasks; 1 sit to stand  2. Coordination hand tasks and fine motor skills practice with coins, card shuffling. . Walking BIG with UE's swing  Patient has unsteady stepping with several exercises but his motor control improve with practice. Patient needs occasional verbal cueing to improve posture and cueing to correctly perform exercises slowly, holding at end of range to increase motor firing of desired muscle to encourage fatigue. Patients performance improves with practice and she uses UE to help support and for balance                          PT Education - 10/11/15 0909    Education provided Yes   Education Details LSVT BIG   Person(s) Educated Patient   Methods Explanation   Comprehension Verbalized understanding             PT Long Term Goals - 09/27/15 1113    PT LONG TERM GOAL #1   Title Patient will be independent in home exercise program to improve strength/mobility for better functional independence with ADLs.   Period Weeks   Status Partially Met   PT LONG TERM GOAL #2  Title Patient (< 47 years old) will complete five times sit to stand test in < 10 seconds indicating an increased LE strength and improved balance.   Period Weeks   Status Partially Met   PT LONG TERM GOAL #3   Title Patient will increase six minute walk test distance to >1000 for progression to community ambulator and improve gait ability   Period Weeks   Status Partially Met   PT LONG TERM GOAL #4   Title Patient will increase 10 meter walk test to >1.75ms as to improve gait speed for better community ambulation and to reduce fall risk   Period Weeks   Status Partially Met   PT LONG TERM GOAL #5   Title Patient will reduce timed up and go to <11 seconds to reduce fall risk and demonstrate improved transfer/gait ability   Period Weeks   Status Partially Met               Plan - 10/11/15 1116     Clinical Impression Statement pt especially has difficulty with opening his hands up towards the ceiling with multiple exercises and with extending his leg during seated side to side multi-directional reaching activity.    Pt will benefit from skilled therapeutic intervention in order to improve on the following deficits Abnormal gait;Decreased strength;Increased muscle spasms;Postural dysfunction;Difficulty walking;Decreased activity tolerance;Impaired sensation;Decreased balance;Decreased cognition;Pain;Decreased coordination;Decreased endurance   Rehab Potential Good   Clinical Impairments Affecting Rehab Potential left shoulder pain with certian positions   PT Frequency 4x / week   PT Duration 4 weeks   PT Treatment/Interventions ADLs/Self Care Home Management;Stair training;Gait training;Therapeutic exercise;Balance training;Neuromuscular re-education;Therapeutic activities   Consulted and Agree with Plan of Care Patient;Family member/caregiver        Problem List Patient Active Problem List   Diagnosis Date Noted  . HB (heart block) 07/26/2015  . Myocardial infarction ([place] 07/26/2015  . Parkinson's disease ([place] 04/07/2014  . Absolute anemia 03/06/2014  . Paroxysmal atrial fibrillation ([place] 03/06/2014  . Abnormal presence of protein in urine 03/06/2014  . Allergic rhinitis 03/04/2014  . Arteriosclerosis of coronary artery 03/04/2014  . Diabetes mellitus ([place] 03/04/2014  . Benign essential HTN 03/04/2014  . Acid reflux 03/04/2014  . HLD (hyperlipidemia) 03/04/2014  . Restless leg 03/04/2014  . Sensory neuropathy ([place] 03/04/2014   KAlanson Bowen PT, DPT MRio Grande CityS 10/11/2015, 11:17 AM  [place] [facility] [address] [place] [address]Phone: [phone]  Fax:  [phone] Name: Johnny HOLNESSMRN: [id number] of Birth: [birth year]

## 2015-10-12 ENCOUNTER — Encounter: Payer: Self-pay | Admitting: Speech Pathology

## 2015-10-12 NOTE — Therapy (Signed)
[place] MAIN [facility] SERVICES [address] [place], [place], [address] Phone: [phone]   Fax:  [phone]  Speech Language Pathology Treatment/Discharge Summary  Patient Details  Name: Johnny Bowen MRN: [id number] Date of Birth: [birth year] Referring Provider: Dr. Manuella Ghazi  Encounter Date: 10/11/2015      End of Session - 10/12/15 0828    Visit Number 15   Number of Visits 17   Date for SLP Re-Evaluation 10/19/15   SLP Start Time 0900   SLP Stop Time  0948   SLP Time Calculation (min) 48 min   Activity Tolerance Patient tolerated treatment well      Past Medical History  Diagnosis Date  . Allergic rhinitis   . Anemia   . Arthritis   . CAD (coronary artery disease)   . Diabetes mellitus with neurological manifestations ([place])   . Episodic atrial fibrillation ([place])   . HTN (hypertension)   . GERD (gastroesophageal reflux disease)   . Heart block   . History of lower GI bleeding   . HLD (hyperlipidemia)   . MI (myocardial infarction) ([place])   . Proteinuria   . RLS (restless legs syndrome)   . Shingles     Past Surgical History  Procedure Laterality Date  . Hernia repair    . Pacemaker placement    . Colonoscopy    . Esophagogastroduodenoscopy      There were no vitals filed for this visit.  Visit Diagnosis: Dysphonia      Subjective Assessment - 10/12/15 0827    Subjective The patient's wife agrees that the patient is articulating better as he learns to be loud.   Patient is accompained by: Family member   Currently in Pain? No/denies               ADULT SLP TREATMENT - 10/12/15 0001    General Information   Behavior/Cognition Alert;Cooperative;Pleasant mood;Hard of hearing   HPI Parkinson's disease   Treatment Provided   Treatment provided Cognitive-Linquistic   Pain Assessment   Pain Assessment No/denies pain   Cognitive-Linquistic Treatment   Treatment focused on Voice   Skilled Treatment Daily  Task #1: Average 9 seconds, 82 dB given mod-max cues for quality. Daily Task 2: Highs: 15 high pitched "ah" given mod-max cues. Lows: 15 low pitched "ah" given mod cues. Daily task #3: Average 75 dB.  Hierarchal speech loudness drill: Generate sentences, 75 dB (requires no cues to slow rate and articulate clearly).  Homework: assignments completed.  Off the cuff remarks: average 74 dB.   Assessment / Recommendations / Plan   Plan Discharge SLP treatment due to (comment)   Progression Toward Goals   Progression toward goals Goals met, education completed, patient discharged from SLP          SLP Education - 10/12/15 0827    Education provided Yes   Education Details Strategies to maintain improvements   Person(s) Educated Patient;Spouse   Methods Explanation   Comprehension Verbalized understanding            SLP Long Term Goals - 10/03/15 1331    SLP LONG TERM GOAL #1   Title The patient will complete Daily Tasks (Maximum duration "ah", High/Lows, and Functional Phrases) at average loudness of 80 dB and with loud, good quality voice.    Time 4   Period Weeks   Status Partially Met   SLP LONG TERM GOAL #2   Title The patient will complete Hierarchal Speech Loudness  reading drills (words/phrases, sentences, and paragraph) at average 75 dB and with loud, good quality voice.     Time 4   Period Weeks   Status Partially Met   SLP LONG TERM GOAL #3   Title The patient will complete homework daily.   Time 4   Period Weeks   Status On-going   SLP LONG TERM GOAL #4   Title The patient will participate in conversation, maintaining average loudness of 75 dB and loud, good quality voice.   Time 4   Period Weeks   Status Partially Met          Plan - 2015-10-13 0828    Clinical Impression Statement The patient has completed the LSVT-LOUD program.  He is completing daily tasks and hierarchal speech drill tasks with loud, good quality voice given significant SLP cues for quality of  sustained vowel and pitch change. However, his overall speech intelligibility and vocal quality is significantly improved.  The patient demonstrates generalization into conversational speech.   Speech Therapy Frequency 4x / week   Duration 4 weeks   Treatment/Interventions Other (comment)  LSVT-LOUD   Potential Considerations Ability to learn/carryover information;Cooperation/participation level;Medical prognosis;Previous level of function;Severity of impairments;Family/community support   SLP Home Exercise Plan Strategies to maintain improvements   Consulted and Agree with Plan of Care Patient;Family member/caregiver   Family Member Consulted Spouse          G-Codes - October 13, 2015 1610    Functional Assessment Tool Used LSVT-LOUD voice protocol, clinical judgment   Functional Limitations Voice   Voice Current Status [phone]) At least 1 percent but less than 20 percent impaired, limited or restricted   Voice Goal Status (W0981) At least 1 percent but less than 20 percent impaired, limited or restricted   Voice Discharge Status (X9147) At least 1 percent but less than 20 percent impaired, limited or restricted      Problem List Patient Active Problem List   Diagnosis Date Noted  . HB (heart block) 07/26/2015  . Myocardial infarction ([place]) 07/26/2015  . Parkinson's disease ([place]) 04/07/2014  . Absolute anemia 03/06/2014  . Paroxysmal atrial fibrillation ([place]) 03/06/2014  . Abnormal presence of protein in urine 03/06/2014  . Allergic rhinitis 03/04/2014  . Arteriosclerosis of coronary artery 03/04/2014  . Diabetes mellitus ([place]) 03/04/2014  . Benign essential HTN 03/04/2014  . Acid reflux 03/04/2014  . HLD (hyperlipidemia) 03/04/2014  . Restless leg 03/04/2014  . Sensory neuropathy ([place]) 03/04/2014   Johnny Sea, MS/CCC- SLP  [name] 10/13/2015, 8:30 AM  [place] MAIN [facility] SERVICES [address] [place], [place],  [address] Phone: [phone]   Fax:  [phone]   Name: Johnny Bowen MRN: [id number] Date of Birth: 1929-09-21

## 2015-10-16 ENCOUNTER — Encounter: Payer: Medicare Other | Admitting: Speech Pathology

## 2015-10-16 ENCOUNTER — Ambulatory Visit: Payer: Medicare Other | Admitting: Physical Therapy

## 2015-10-16 ENCOUNTER — Encounter: Payer: Self-pay | Admitting: Physical Therapy

## 2015-10-16 DIAGNOSIS — M6281 Muscle weakness (generalized): Secondary | ICD-10-CM

## 2015-10-16 DIAGNOSIS — R279 Unspecified lack of coordination: Secondary | ICD-10-CM

## 2015-10-16 DIAGNOSIS — R490 Dysphonia: Secondary | ICD-10-CM | POA: Diagnosis not present

## 2015-10-16 DIAGNOSIS — R278 Other lack of coordination: Secondary | ICD-10-CM

## 2015-10-16 DIAGNOSIS — R200 Anesthesia of skin: Secondary | ICD-10-CM

## 2015-10-16 DIAGNOSIS — R262 Difficulty in walking, not elsewhere classified: Secondary | ICD-10-CM

## 2015-10-16 NOTE — Therapy (Addendum)
[place] MAIN [facility] SERVICES [address] [place], [place], [address] Phone: [phone]   Fax:  [phone]  Physical Therapy Treatment  Patient Details  Name: Johnny Bowen MRN: [id number] Date of Birth: 03-18-1930 Referring Provider: shah  Encounter Date: 10/16/2015      PT End of Session - 10/16/15 1000    Visit Number 15   PT Start Time 1000   PT Stop Time 1100   PT Time Calculation (min) 60 min   Activity Tolerance Patient tolerated treatment well      Past Medical History  Diagnosis Date  . Allergic rhinitis   . Anemia   . Arthritis   . CAD (coronary artery disease)   . Diabetes mellitus with neurological manifestations ([place])   . Episodic atrial fibrillation ([place])   . HTN (hypertension)   . GERD (gastroesophageal reflux disease)   . Heart block   . History of lower GI bleeding   . HLD (hyperlipidemia)   . MI (myocardial infarction) ([place])   . Proteinuria   . RLS (restless legs syndrome)   . Shingles     Past Surgical History  Procedure Laterality Date  . Hernia repair    . Pacemaker placement    . Colonoscopy    . Esophagogastroduodenoscopy      There were no vitals filed for this visit.  Visit Diagnosis:  Difficulty walking  Muscle weakness  Decreased coordination  Numbness in feet      Subjective Assessment - 10/16/15 0959    Subjective Patient is doing his exercises.    Patient is accompained by: Family member   Pertinent History neuropathy, restless leg syndrome, ,Diabetes, high blood pressure, pace maker , a -fib, leg cramps, trouble with steps, rising from a chair,       Patient seen for LSVT Daily Session Maximal Daily Exercises for facilitation/coordination of movement Maximum Sustained Movements are designed to rescale the amplitude of movement output for generalization to daily functional activities. Patient seen for LSVT Daily Session Maximal Daily Exercises for facilitation/coordination  of movement Sustained movements are designed to rescale the amplitude of movement output for generalization to daily functional activities Performed as follows for 1 set of 10 repetitions each multidirectional sustained movements 1) Floor to ceiling 2) Side to side multidirectional Repetitive movements performed in standing and are designed to provide retraining effort needed for sustained muscle activation in tasks   Patient seen for LSVT Daily Session Maximal Daily Exercises for facilitation/coordination of movement Maximum Sustained Movements are designed to rescale the amplitude of movement output for generalization to daily functional activities Performed as follows 3) Step and reach forward 4) Step and reach backwards 5) Step and reach sideways 6) Rock and reach forward/backward 7) Rock and reach sideways Functional tasks; 1 sit to stand  2. Coordination hand tasks and fine motor skills practice with coins, card shuffling.Sit to stand functional component task with supervision 5 reps, peg board with pinching tool set at second easiest setting 15 pegs L and R hand, selecting 6 paper clips one at a time form jar and pinching open to secure on side of a magazine, putting together shapes and tubes to copy a picture.  Functional mobility: ambulated >650' with fair cadence and reciprocating arm swing  while doing dual naming tasks   Patient has unsteady stepping with several exercises but his motor control improve with practice. Patient needs occasional verbal cueing to improve posture and cueing to correctly perform exercises slowly, holding  at end of range to increase motor firing of desired muscle to encourage fatigue  Patients performance improves with practice and  uses UE to help support and for balance                           PT Education - 10/16/15 0959    Education provided Yes   Education Details LSVT BIG   Person(s) Educated Patient   Methods Explanation    Comprehension Verbalized understanding             PT Long Term Goals - 09/27/15 1113    PT LONG TERM GOAL #1   Title Patient will be independent in home exercise program to improve strength/mobility for better functional independence with ADLs.   Period Weeks   Status Partially Met   PT LONG TERM GOAL #2   Title Patient (< 64 years old) will complete five times sit to stand test in < 10 seconds indicating an increased LE strength and improved balance.   Period Weeks   Status Partially Met   PT LONG TERM GOAL #3   Title Patient will increase six minute walk test distance to >1000 for progression to community ambulator and improve gait ability   Period Weeks   Status Partially Met   PT LONG TERM GOAL #4   Title Patient will increase 10 meter walk test to >1.83ms as to improve gait speed for better community ambulation and to reduce fall risk   Period Weeks   Status Partially Met   PT LONG TERM GOAL #5   Title Patient will reduce timed up and go to <11 seconds to reduce fall risk and demonstrate improved transfer/gait ability   Period Weeks   Status Partially Met               Plan - 10/16/15 1001    Clinical Impression Statement Continues to have balance deficits typical with diagnosis. Patient performs intermediate level exercises without pain behaviors and needs verbal cuing for postural alignment and head positioning   Pt will benefit from skilled therapeutic intervention in order to improve on the following deficits Abnormal gait;Decreased strength;Increased muscle spasms;Postural dysfunction;Difficulty walking;Decreased activity tolerance;Impaired sensation;Decreased balance;Decreased cognition;Pain;Decreased coordination;Decreased endurance   Rehab Potential Good   Clinical Impairments Affecting Rehab Potential left shoulder pain with certian positions   PT Frequency 4x / week   PT Duration 4 weeks   PT Treatment/Interventions ADLs/Self Care Home Management;Stair  training;Gait training;Therapeutic exercise;Balance training;Neuromuscular re-education;Therapeutic activities   Consulted and Agree with Plan of Care Patient;Family member/caregiver        Problem List Patient Active Problem List   Diagnosis Date Noted  . HB (heart block) 07/26/2015  . Myocardial infarction ([place] 07/26/2015  . Parkinson's disease ([place] 04/07/2014  . Absolute anemia 03/06/2014  . Paroxysmal atrial fibrillation ([place] 03/06/2014  . Abnormal presence of protein in urine 03/06/2014  . Allergic rhinitis 03/04/2014  . Arteriosclerosis of coronary artery 03/04/2014  . Diabetes mellitus ([place] 03/04/2014  . Benign essential HTN 03/04/2014  . Acid reflux 03/04/2014  . HLD (hyperlipidemia) 03/04/2014  . Restless leg 03/04/2014  . Sensory neuropathy ([place] 03/04/2014   KAlanson Puls PT, DPT [place] 10/16/2015, 1:23 PM  [place] [facility]ERVICES [address] [address]Phone: [phone]  Fax:  [phone] Name: RSTEPHANIE MCGLONEMRN: [id number] of Birth: 102-Dec-1931

## 2015-10-18 ENCOUNTER — Encounter: Payer: Self-pay | Admitting: Physical Therapy

## 2015-10-18 ENCOUNTER — Ambulatory Visit: Payer: Medicare Other | Admitting: Physical Therapy

## 2015-10-18 DIAGNOSIS — R279 Unspecified lack of coordination: Secondary | ICD-10-CM

## 2015-10-18 DIAGNOSIS — R200 Anesthesia of skin: Secondary | ICD-10-CM

## 2015-10-18 DIAGNOSIS — M6281 Muscle weakness (generalized): Secondary | ICD-10-CM

## 2015-10-18 DIAGNOSIS — R278 Other lack of coordination: Secondary | ICD-10-CM

## 2015-10-18 DIAGNOSIS — R262 Difficulty in walking, not elsewhere classified: Secondary | ICD-10-CM

## 2015-10-18 DIAGNOSIS — R490 Dysphonia: Secondary | ICD-10-CM | POA: Diagnosis not present

## 2015-10-18 NOTE — Therapy (Signed)
[name] MAIN [facility] SERVICES [address] [name], [place], [address] Phone: [phone]   Fax:  [phone]  Physical Therapy Treatment/DC Summary  Patient Details  Name: Johnny Bowen MRN: [id number] Date of Birth: [birth year] Referring Provider: shah  Encounter Date: 10/18/2015      PT End of Session - 10/18/15 1020    Visit Number 16   Number of Visits 17   Date for PT Re-Evaluation 2015/10/23   Authorization Type g codes   PT Start Time 1000   PT Stop Time 1100   PT Time Calculation (min) 60 min   Activity Tolerance Patient tolerated treatment well      Past Medical History  Diagnosis Date  . Allergic rhinitis   . Anemia   . Arthritis   . CAD (coronary artery disease)   . Diabetes mellitus with neurological manifestations (HCC)   . Episodic atrial fibrillation (HCC)   . HTN (hypertension)   . GERD (gastroesophageal reflux disease)   . Heart block   . History of lower GI bleeding   . HLD (hyperlipidemia)   . MI (myocardial infarction) (HCC)   . Proteinuria   . RLS (restless legs syndrome)   . Shingles     Past Surgical History  Procedure Laterality Date  . Hernia repair    . Pacemaker placement    . Colonoscopy    . Esophagogastroduodenoscopy      There were no vitals filed for this visit.  Visit Diagnosis:  Difficulty walking  Muscle weakness  Decreased coordination  Numbness in feet      Subjective Assessment - 10/18/15 1019    Subjective Patient is doing his exercises.    Patient is accompained by: Family member   Pertinent History neuropathy, restless leg syndrome, ,Diabetes, high blood pressure, pace maker , a -fib, leg cramps, trouble with steps, rising from a chair,    Currently in Pain? No/denies     TEST Outcome Interpretation  5 times sit<>stand sec.13.82 >45 yo, >15 sec indicates increased risk for falls  10 meter walk test  1.68 m/s <1.0 m/s indicates  increased risk for falls; limited community ambulator  Timed up and Go 10.14 tug carry  10.66 tug cog ,11.64  sec <14 sec indicates increased risk for falls  6 minute walk test  1300 Feet 1000 feet is community Financial controller  <36/56 (100% risk for falls), 37-45 (80% risk for falls); 46-51 (>50% risk for falls); 52-55 (lower risk <25% of falls)  9 Hole Peg Test L: 36.19 R: ,46.81               Patient seen for LSVT Daily Session Maximal Daily Exercises for facilitation/coordination of movement Sustained movements are designed to rescale the amplitude of movement output for generalization to daily functional activities .Performed as follows for 1 set of 10 repetitions each multidirectional sustained movements 1) Floor to ceiling 2) Side to side multidirectional Repetitive movements performed in standing and are designed to provide retraining effort needed for sustained muscle activation in tasks   3) Step and reach forward 4) Step and reach backwards 5) Step and reach sideways 6) Rock and reach forward/backward 7) Rock and reach sideways Sit to stand functional component task with supervision 5 reps and  simulated .activities for: getting in and out of the car, sliding down a booth in a restaurant, carrying objects in the house, picking up objects off the floor, tying shoes, and managing buttons.  Coordination tasks : peg board with pinching tool ,15 pegs L and R hand, selecting 6 paper clips one at a time form jar and pinching open to secure on side of a magazine, pushing coins into a coin counter,  shuffling cards, putting tooth picks into a peg board.  Functional mobility: ambulated >1000' with fair cadence and reciprocating arm swing, while doing dual naming tasks.                               PT Education - 2015/11/07 1019    Education provided Yes   Education Details LSVT BIG   Person(s)  Educated Patient   Methods Explanation   Comprehension Verbalized understanding             PT Long Term Goals - 11-07-2015 1106    PT LONG TERM GOAL #1   Title Patient will be independent in home exercise program to improve strength/mobility for better functional independence with ADLs.   Time 4   Status Achieved   PT LONG TERM GOAL #2   Title Patient (< 66 years old) will complete five times sit to stand test in < 10 seconds indicating an increased LE strength and improved balance.   Time 4   Period Weeks   Status Achieved   PT LONG TERM GOAL #3   Title Patient will increase six minute walk test distance to >1000 for progression to community ambulator and improve gait ability   Time 4   Period Weeks   Status Achieved   PT LONG TERM GOAL #4   Title Patient will increase 10 meter walk test to >1.51m/s as to improve gait speed for better community ambulation and to reduce fall risk   Time 4   Period Weeks   Status Achieved   PT LONG TERM GOAL #5   Title Patient will reduce timed up and go to <11 seconds to reduce fall risk and demonstrate improved transfer/gait ability               Plan - 2015-11-07 1020    Clinical Impression Statement CGA to SBA for safety with activities.  Uses to increase intensity and amplitude of movements throughout session   Pt will benefit from skilled therapeutic intervention in order to improve on the following deficits Abnormal gait;Decreased strength;Increased muscle spasms;Postural dysfunction;Difficulty walking;Decreased activity tolerance;Impaired sensation;Decreased balance;Decreased cognition;Pain;Decreased coordination;Decreased endurance   Rehab Potential Good   Clinical Impairments Affecting Rehab Potential left shoulder pain with certian positions   PT Frequency 4x / week   PT Duration 4 weeks   PT Treatment/Interventions ADLs/Self Care Home Management;Stair training;Gait training;Therapeutic exercise;Balance training;Neuromuscular  re-education;Therapeutic activities   Consulted and Agree with Plan of Care Patient;Family member/caregiver          G-Codes - Nov 07, 2015 1021    Functional Assessment Tool Used 5 x sit to stand, TUG, 10 MW, 6 MW, peg test   Functional Limitation Mobility: Walking and moving around   Mobility: Walking and Moving Around Current Status [phone]) At least 1 percent but less than 20 percent impaired, limited or restricted   Mobility: Walking and Moving Around Goal Status [phone]) At least 1 percent but less than 20 percent impaired, limited or restricted   Mobility: Walking and Moving Around Discharge Status [phone]) At least 1 percent but less than 20 percent impaired, limited or restricted      Problem List Patient Active Problem List   Diagnosis  Date Noted  . HB (heart block) 07/26/2015  . Myocardial infarction (HCC) 07/26/2015  . Parkinson's disease (HCC) 04/07/2014  . Absolute anemia 03/06/2014  . Paroxysmal atrial fibrillation (HCC) 03/06/2014  . Abnormal presence of protein in urine 03/06/2014  . Allergic rhinitis 03/04/2014  . Arteriosclerosis of coronary artery 03/04/2014  . Diabetes mellitus (HCC) 03/04/2014  . Benign essential HTN 03/04/2014  . Acid reflux 03/04/2014  . HLD (hyperlipidemia) 03/04/2014  . Restless leg 03/04/2014  . Sensory neuropathy (HCC) 03/04/2014   [name], PT, DPT [place], [place] Bruns S 10/18/2015, 11:07 AM  [facility] MAIN [facility] SERVICES [address] [place], [place], [address] Phone: [phone]   Fax:  [phone]  Name: Johnny Bowen MRN: [id number] Date of Birth: 03/20/1930

## 2015-11-20 ENCOUNTER — Ambulatory Visit: Payer: Medicare Other | Admitting: Obstetrics and Gynecology

## 2015-11-20 ENCOUNTER — Other Ambulatory Visit: Payer: Self-pay | Admitting: Neurology

## 2015-11-20 DIAGNOSIS — R251 Tremor, unspecified: Secondary | ICD-10-CM

## 2015-11-26 ENCOUNTER — Ambulatory Visit
Admission: RE | Admit: 2015-11-26 | Discharge: 2015-11-26 | Disposition: A | Discharge status: Home / Self Care | Payer: Medicare Other | Source: Ambulatory Visit | Attending: Neurology | Admitting: Neurology

## 2015-11-26 DIAGNOSIS — R251 Tremor, unspecified: Secondary | ICD-10-CM | POA: Diagnosis present

## 2015-11-26 DIAGNOSIS — I739 Peripheral vascular disease, unspecified: Secondary | ICD-10-CM | POA: Diagnosis not present

## 2015-12-10 ENCOUNTER — Encounter: Payer: Self-pay | Admitting: Urology

## 2015-12-10 ENCOUNTER — Ambulatory Visit (INDEPENDENT_AMBULATORY_CARE_PROVIDER_SITE_OTHER): Payer: Medicare Other | Admitting: Urology

## 2015-12-10 VITALS — BP 167/85 | HR 84 | Ht 68.0 in | Wt 185.7 lb

## 2015-12-10 DIAGNOSIS — N390 Urinary tract infection, site not specified: Secondary | ICD-10-CM | POA: Diagnosis not present

## 2015-12-10 DIAGNOSIS — N201 Calculus of ureter: Secondary | ICD-10-CM | POA: Diagnosis not present

## 2015-12-10 DIAGNOSIS — R3129 Other microscopic hematuria: Secondary | ICD-10-CM | POA: Diagnosis not present

## 2015-12-10 NOTE — Progress Notes (Signed)
4:02 PM   Johnny Bowen Mar 17, 1930 [id number]  Referring provider: Mickey Farber, MD [address] [facility] [place], [place] [address]  Chief Complaint  Patient presents with  . Hematuria    microscopic 3 month follow up    HPI: Patient is an 82 year old Caucasian male with a history of recurrent urinary tract infection and a possible 2-3 mm right distal ureteral stone who presents today for 3 month follow-up.  Previous History: Patient is an 82 yo male with a history of Parkinson's disease, CAD, MI and DM presenting today as a referral in his primary care provider for recurrent urinary tract infections over the last 4 months. Patient's wife states he originally presented with symptoms of severe malaise and altered mental status. He was found to have a urinary tract infection and treated with Levaquin.  UC E. Coli + on 07/03/15 and 03/09/15 Negative culture on 07/26/15 following treatment with Levaquin. 07/03/15 Cr 1.1  Renal ultrasound was unremarkable. CT scan showed a possible 2-3 mm right distal ureteral stone without signs of obstruction.  Denies any urinary symptoms at baseline. Specifically he denies urinary frequency, urgency, gross hematuria, hesitancy or sensation of incomplete bladder emptying.  His IPSS score is 15/2.        IPSS      12/10/15 1500       International Prostate Symptom Score   How often have you had the sensation of not emptying your bladder? About half the time     How often have you had to urinate less than every two hours? Less than half the time     How often have you found you stopped and started again several times when you urinated? Less than half the time     How often have you found it difficult to postpone urination? Almost always     How often have you had a weak urinary stream? Less than half the time     How often have you had to strain to start urination? Not at All     How many times did you typically get up at night  to urinate? 1 Time     Total IPSS Score 15     Quality of Life due to urinary symptoms   If you were to spend the rest of your life with your urinary condition just the way it is now how would you feel about that? Mostly Satisfied        Score:  1-7 Mild 8-19 Moderate 20-35 Severe  No history of GU or prostate cancers. No history of renal stones. Minimal smoking history (occasional cigars). No chemical exposures.  S/p bilateral inguinal hernia repair in the 1980s.  H2O intake 3 16oz bottle of water per day.   His UA today demonstrates 3-10RBC's/hpf.  He has not passed any stone fragments.     PMH: Past Medical History  Diagnosis Date  . Allergic rhinitis   . Anemia   . Arthritis   . CAD (coronary artery disease)   . Diabetes mellitus with neurological manifestations (HCC)   . Episodic atrial fibrillation (HCC)   . HTN (hypertension)   . GERD (gastroesophageal reflux disease)   . Heart block   . History of lower GI bleeding   . HLD (hyperlipidemia)   . MI (myocardial infarction) (HCC)   . Proteinuria   . RLS (restless legs syndrome)   . Shingles     Surgical History: Past Surgical History  Procedure Laterality Date  .  Hernia repair    . Pacemaker placement    . Colonoscopy    . Esophagogastroduodenoscopy      Home Medications:    Medication List       This list is accurate as of: 12/10/15  4:02 PM.  Always use your most recent med list.               amiodarone 200 MG tablet  Commonly known as:  PACERONE  Take by mouth.     carbidopa-levodopa 25-100 MG tablet  Commonly known as:  SINEMET IR  Take by mouth.     doxepin 75 MG capsule  Commonly known as:  SINEQUAN     ELIQUIS 5 MG Tabs tablet  Generic drug:  apixaban  Take by mouth.     fluticasone 50 MCG/ACT nasal spray  Commonly known as:  FLONASE  Place into the nose.     latanoprost 0.005 % ophthalmic solution  Commonly known as:  XALATAN  Apply to eye.     metFORMIN 500 MG tablet   Commonly known as:  GLUCOPHAGE     nitrofurantoin (macrocrystal-monohydrate) 100 MG capsule  Commonly known as:  MACROBID  Reported on 12/10/2015     nitroGLYCERIN 0.4 MG SL tablet  Commonly known as:  NITROSTAT  Place under the tongue.     Omeprazole 20 MG Tbec  Take by mouth.     oxyCODONE 5 MG immediate release tablet  Commonly known as:  Oxy IR/ROXICODONE  1 po tid prn Earliest Fill Date: 07/25/15     pramipexole 1 MG tablet  Commonly known as:  MIRAPEX  Take by mouth.     pramipexole 1 MG tablet  Commonly known as:  MIRAPEX     pravastatin 40 MG tablet  Commonly known as:  PRAVACHOL  Take by mouth.     pregabalin 100 MG capsule  Commonly known as:  LYRICA  1 po qHS        Allergies:  Allergies  Allergen Reactions  . Atorvastatin     Other reaction(s): Other (See Comments) Restless leg syndrome  . Esomeprazole Magnesium     Other reaction(s): Other (See Comments) Stomach irritation    Family History: Family History  Problem Relation Age of Onset  . Kidney disease Neg Hx   . Prostate cancer Neg Hx     Social History:  reports that he quit smoking about 20 years ago. He does not have any smokeless tobacco history on file. He reports that he does not drink alcohol or use illicit drugs.  ROS: UROLOGY Frequent Urination?: No Hard to postpone urination?: No Burning/pain with urination?: No Get up at night to urinate?: No Leakage of urine?: No Urine stream starts and stops?: No Trouble starting stream?: No Do you have to strain to urinate?: No Blood in urine?: No Urinary tract infection?: No Sexually transmitted disease?: No Injury to kidneys or bladder?: No Painful intercourse?: No Weak stream?: No Erection problems?: No Penile pain?: No  Gastrointestinal Nausea?: No Vomiting?: No Indigestion/heartburn?: No Diarrhea?: No Constipation?: No  Constitutional Fever: No Night sweats?: No Weight loss?: No Fatigue?: No  Skin Skin  rash/lesions?: No Itching?: No  Eyes Blurred vision?: No Double vision?: No  Ears/Nose/Throat Sore throat?: No Sinus problems?: No  Hematologic/Lymphatic Swollen glands?: No Easy bruising?: No  Cardiovascular Leg swelling?: No Chest pain?: No  Respiratory Cough?: No Shortness of breath?: No  Endocrine Excessive thirst?: No  Musculoskeletal Back pain?: No Joint pain?: No  Neurological  Headaches?: No Dizziness?: No  Psychologic Depression?: No Anxiety?: No  Physical Exam: BP 167/85 mmHg  Pulse 84  Ht  (1.727 m)  Wt 185 lb 11.2 oz (84.233 kg)  BMI 28.24 kg/m2  Constitutional:  Alert and oriented,  No acute distress. HEENT: [place] AT, moist mucus membranes.  Trachea midline, no masses. Cardiovascular: No clubbing, cyanosis, or edema. Respiratory: Normal respiratory effort, no increased work of breathing. Skin: No rashes, bruises or suspicious lesions. Lymph: No cervical or inguinal adenopathy. Neurologic:  moving upper extremities. Psychiatric: Normal mood,  Flat affect.  Laboratory Data:  Urinalysis Results for orders placed or performed in visit on 12/10/15  Microscopic Examination  Result Value Ref Range   WBC, UA 0-5 0 -  5 /hpf   RBC, UA 3-10 (A) 0 -  2 /hpf   Epithelial Cells (non renal) 0-10 0 - 10 /hpf   Bacteria, UA None seen None seen/Few  Urinalysis, Complete  Result Value Ref Range   Specific Gravity, UA 1.020 1.005 - 1.030   pH, UA 7.0 5.0 - 7.5   Color, UA Yellow Yellow   Appearance Ur Clear Clear   Leukocytes, UA Negative Negative   Protein, UA Negative Negative/Trace   Glucose, UA Negative Negative   Ketones, UA Negative Negative   RBC, UA Negative Negative   Bilirubin, UA Negative Negative   Urobilinogen, Ur >8.0 (H) 0.2 - 1.0 mg/dL   Nitrite, UA Negative Negative   Microscopic Examination See below:     Pertinent Imaging: CLINICAL DATA: Mass left inguinal region. Recurrent UTI. Prior left inguinal  hernia.  EXAM: SCROTAL ULTRASOUND  DOPPLER ULTRASOUND OF THE TESTICLES  TECHNIQUE: Complete ultrasound examination of the testicles, epididymis, and other scrotal structures was performed. Color and spectral Doppler ultrasound were also utilized to evaluate blood flow to the testicles.  COMPARISON: CT 08/09/2015 .  FINDINGS: Right testicle  Measurements: 2.3 x 1.0 x 1.6 cm. No mass or microlithiasis visualized.  Left testicle  Measurements: 2.8 x 1.4 x 1.9 cm. Two small cysts are noted in the left testicle, the largest measures 6 mm.  Right epididymis: Normal in size and appearance.  Left epididymis: Normal in size and appearance.  Hydrocele: None visualized.  Varicocele: None visualized.  Pulsed Doppler interrogation of both testes demonstrates normal low resistance arterial and venous waveforms bilaterally.  IMPRESSION: No significant abnormality.   Electronically Signed  By: Maisie Fus Register  On: 08/09/2015 11:51 EXAM: CT ABDOMEN AND PELVIS WITHOUT CONTRAST  TECHNIQUE: Multidetector CT imaging of the abdomen and pelvis was performed following the standard protocol without IV contrast.  COMPARISON: None.  FINDINGS: Mosaic appearance of aeration at the lung bases most likely represent small airways disease. No definite active infiltrate or effusion is seen. Three vessel coronary artery calcifications are noted. Hepatic calcifications are noted most consistent with prior granulomatous disease. There is some dependent debris within the gallbladder. This could represent gallbladder sludge versus noncalcified gallstones. No gallbladder wall thickening is seen. The pancreas is normal in size and the pancreatic duct is not dilated. The adrenal glands and spleen are unremarkable. The stomach is not well distended. No renal calculi are seen and there is no evidence of hydronephrosis. A single peripheral calcification is  noted anteriorly in the upper left kidney of doubtful significance. The abdominal aorta is normal in caliber with moderate atherosclerotic change present. No aneurysmal dilatation is seen.  The urinary bladder is not well distended but no intrinsic abnormality is seen. However, there is a small  focus of calcification at the expected right UV junction most consistent with a nonobstructing right UV junction calculus of 2-3 mm. The distal left ureter is normal in caliber. The prostate is small. The appendix and terminal ileum are unremarkable. There are degenerative changes in both hips and degenerative disc disease is noted at L4-5 and L5-S1.  IMPRESSION: 1. Small calcific density at the expected right UV junction may represent a nonobstructing 2-3 mm right UV junction calculus. 2. No additional renal or ureteral calculi are seen. 3. 3 vessel coronary artery disease. 4. Suspect gallbladder sludge. No definite calcified gallstones. Electronically Signed  By: Dwyane Dee M.D.  On: 08/09/2015 11:08    Assessment & Plan:  1. Recurrent UTI-  UA is negative for infection today and he is not having symptoms.  We will continue to monitor.  2. Microscopic hematuria-  3-10 RBCs seen on today's urinalysis.  We will continue to monitor.  He will have a UA in 2 to 3 weeks.    3. Right ureteral stone:   Patient has 3-10 RBC's in his UA today.  He will RTC in 2-3 weeks to recheck for microscopic hematuria.     Return for UA in 2-3 weeks.  These notes generated with voice recognition software. I apologize for typographical errors.  Michiel Cowboy, PA-C  [facility] [address], [address] [place], [place] [address] [phone]

## 2015-12-11 LAB — URINALYSIS, COMPLETE
BILIRUBIN UA: NEGATIVE
GLUCOSE, UA: NEGATIVE
KETONES UA: NEGATIVE
Leukocytes, UA: NEGATIVE
NITRITE UA: NEGATIVE
PROTEIN UA: NEGATIVE
RBC UA: NEGATIVE
Specific Gravity, UA: 1.02 (ref 1.005–1.030)
pH, UA: 7 (ref 5.0–7.5)

## 2015-12-11 LAB — MICROSCOPIC EXAMINATION: Bacteria, UA: NONE SEEN

## 2016-03-17 ENCOUNTER — Ambulatory Visit: Payer: Medicare Other

## 2016-03-17 ENCOUNTER — Ambulatory Visit
Admission: EM | Admit: 2016-03-17 | Discharge: 2016-03-17 | Disposition: A | Discharge status: Home / Self Care | Payer: Medicare Other | Attending: Family Medicine | Admitting: Family Medicine

## 2016-03-17 DIAGNOSIS — I480 Paroxysmal atrial fibrillation: Secondary | ICD-10-CM | POA: Insufficient documentation

## 2016-03-17 DIAGNOSIS — S60211A Contusion of right wrist, initial encounter: Secondary | ICD-10-CM

## 2016-03-17 DIAGNOSIS — D649 Anemia, unspecified: Secondary | ICD-10-CM | POA: Insufficient documentation

## 2016-03-17 DIAGNOSIS — I10 Essential (primary) hypertension: Secondary | ICD-10-CM | POA: Diagnosis not present

## 2016-03-17 DIAGNOSIS — G2581 Restless legs syndrome: Secondary | ICD-10-CM | POA: Diagnosis not present

## 2016-03-17 DIAGNOSIS — G20 Parkinson's disease: Secondary | ICD-10-CM | POA: Diagnosis not present

## 2016-03-17 DIAGNOSIS — K219 Gastro-esophageal reflux disease without esophagitis: Secondary | ICD-10-CM | POA: Diagnosis not present

## 2016-03-17 DIAGNOSIS — S5291XA Unspecified fracture of right forearm, initial encounter for closed fracture: Secondary | ICD-10-CM | POA: Insufficient documentation

## 2016-03-17 DIAGNOSIS — Z79899 Other long term (current) drug therapy: Secondary | ICD-10-CM | POA: Diagnosis not present

## 2016-03-17 DIAGNOSIS — S20211A Contusion of right front wall of thorax, initial encounter: Secondary | ICD-10-CM | POA: Diagnosis not present

## 2016-03-17 DIAGNOSIS — M1990 Unspecified osteoarthritis, unspecified site: Secondary | ICD-10-CM | POA: Diagnosis not present

## 2016-03-17 DIAGNOSIS — Z7901 Long term (current) use of anticoagulants: Secondary | ICD-10-CM | POA: Diagnosis not present

## 2016-03-17 DIAGNOSIS — I252 Old myocardial infarction: Secondary | ICD-10-CM | POA: Insufficient documentation

## 2016-03-17 DIAGNOSIS — E119 Type 2 diabetes mellitus without complications: Secondary | ICD-10-CM | POA: Diagnosis not present

## 2016-03-17 DIAGNOSIS — E785 Hyperlipidemia, unspecified: Secondary | ICD-10-CM | POA: Diagnosis not present

## 2016-03-17 DIAGNOSIS — W19XXXA Unspecified fall, initial encounter: Secondary | ICD-10-CM | POA: Diagnosis not present

## 2016-03-17 DIAGNOSIS — I2510 Atherosclerotic heart disease of native coronary artery without angina pectoris: Secondary | ICD-10-CM | POA: Diagnosis not present

## 2016-03-17 DIAGNOSIS — G629 Polyneuropathy, unspecified: Secondary | ICD-10-CM | POA: Diagnosis not present

## 2016-03-17 DIAGNOSIS — M25531 Pain in right wrist: Secondary | ICD-10-CM | POA: Diagnosis present

## 2016-03-17 NOTE — ED Notes (Signed)
Patient fell off of his lawnmower. He states that the grass was wet and he was on a hill and he fell over on the lawnmower around 7pm last night. Patient presents today with right hand and right wrist pain. Patient has swelling and bruising at sight. Patient states that hand hurts with movement.

## 2016-03-17 NOTE — Discharge Instructions (Signed)
Take home medication as prescribed. Elevate and apply ice. Keep in splint. Take deep breaths multiple times throughout the day.   Follow up with orthopedic this Wednesday at 10am as scheduled, see above.   Follow up with your primary care physician this week for follow up. Return to Urgent care for new or worsening concerns.

## 2016-03-17 NOTE — ED Provider Notes (Signed)
[facility]  ____________________________________________  Time seen: Approximately 10:50 AM  I have reviewed the triage vital signs and the nursing notes.   HISTORY  Chief Complaint Fall   HPI Johnny Bowen is a 82 y.o. male presents with wife at bedside for the complaints of right wrist pain. Patient and wife reports that last night at approximately 7 PM he was riding around outside on his lawnmower to look at things. Patient reports he was not mowing the yard and blade was up. Patient reports that the ground was wet and he was going on a slight hill. Patient states that when he was riding on the slight hill, the lawnmower started to shift due to wet grass and roll on to its side. Patient states that he did fall off of the lawnmower and did try to catch himself with his right wrist. States fell on outstretched wrist. States the lawnmower did not fall on him. Denies head injury or loss of consciousness. Reports was able to quickly get self up.    patient and wife reports that they did elevate right wrist and apply ice last night. Patient states mild pain to right wrist at this time.   Denies other pain. Denies head injury or loss of consciousness. Wife denies behavior changes. Denies chest pain, shortness of breath, abdominal pain, other extremity pain or injury, neck pain, back pain, headache, vision changes, nausea, vomiting and diarrhea or weakness. Patient reports that he does take chronic pain medication including OxyContin and oxycodone for neuropathic pain.  PCP: Dr. Audelia Acton   Past Medical History  Diagnosis Date  . Allergic rhinitis   . Anemia   . Arthritis   . CAD (coronary artery disease)   . Diabetes mellitus with neurological manifestations (HCC)   . Episodic atrial fibrillation (HCC)   . HTN (hypertension)   . GERD (gastroesophageal reflux disease)   . Heart block   . History of lower GI bleeding   . HLD (hyperlipidemia)   . MI (myocardial infarction)  (HCC)   . Proteinuria   . RLS (restless legs syndrome)   . Shingles     Patient Active Problem List   Diagnosis Date Noted  . Microscopic hematuria 12/10/2015  . Recurrent UTI 12/10/2015  . Right ureteral stone 12/10/2015  . HB (heart block) 07/26/2015  . Myocardial infarction (HCC) 07/26/2015  . Parkinson's disease (HCC) 04/07/2014  . Absolute anemia 03/06/2014  . Paroxysmal atrial fibrillation (HCC) 03/06/2014  . Abnormal presence of protein in urine 03/06/2014  . Allergic rhinitis 03/04/2014  . Arteriosclerosis of coronary artery 03/04/2014  . Diabetes mellitus (HCC) 03/04/2014  . Benign essential HTN 03/04/2014  . Acid reflux 03/04/2014  . HLD (hyperlipidemia) 03/04/2014  . Restless leg 03/04/2014  . Sensory neuropathy (HCC) 03/04/2014    Past Surgical History  Procedure Laterality Date  . Hernia repair    . Pacemaker placement    . Colonoscopy    . Esophagogastroduodenoscopy    . Cataract extraction      Current Outpatient Rx  Name  Route  Sig  Dispense  Refill  . amiodarone (PACERONE) 200 MG tablet   Oral   Take by mouth.         Marland Kitchen apixaban (ELIQUIS) 5 MG TABS tablet   Oral   Take by mouth.         . carbidopa-levodopa (SINEMET IR) 25-100 MG tablet   Oral   Take by mouth.         Marland Kitchen  doxepin (SINEQUAN) 75 MG capsule               . fluticasone (FLONASE) 50 MCG/ACT nasal spray   Nasal   Place into the nose.         . latanoprost (XALATAN) 0.005 % ophthalmic solution   Ophthalmic   Apply to eye.         . metFORMIN (GLUCOPHAGE) 500 MG tablet               . nitroGLYCERIN (NITROSTAT) 0.4 MG SL tablet   Sublingual   Place under the tongue.         Marland Kitchen Omeprazole 20 MG TBEC   Oral   Take by mouth.         . oxyCODONE (OXY IR/ROXICODONE) 5 MG immediate release tablet      1 po tid prn Earliest Fill Date: 07/25/15         . pramipexole (MIRAPEX) 1 MG tablet               . pravastatin (PRAVACHOL) 40 MG tablet   Oral    Take by mouth.         . pregabalin (LYRICA) 100 MG capsule      1 po qHS         . nitrofurantoin, macrocrystal-monohydrate, (MACROBID) 100 MG capsule      Reported on 12/10/2015         . EXPIRED: pramipexole (MIRAPEX) 1 MG tablet   Oral   Take by mouth.         eliquis  Allergies Atorvastatin and Esomeprazole magnesium  Family History  Problem Relation Age of Onset  . Kidney disease Neg Hx   . Prostate cancer Neg Hx     Social History Social History  Substance Use Topics  . Smoking status: Former Smoker    Quit date: 07/31/1995  . Smokeless tobacco: None  . Alcohol Use: No    Review of Systems Constitutional: No fever/chills Eyes: No visual changes. ENT: No sore throat. Cardiovascular: Denies chest pain. Respiratory: Denies shortness of breath. Gastrointestinal: No abdominal pain.  No nausea, no vomiting.  No diarrhea.  No constipation. Genitourinary: Negative for dysuria. Musculoskeletal: Negative for back pain.As above. Skin: Negative for rash. Neurological: Negative for headaches, focal weakness or numbness.  10-point ROS otherwise negative.  ____________________________________________   PHYSICAL EXAM:  VITAL SIGNS: ED Triage Vitals  Enc Vitals Group     BP 03/17/16 1004 116/60 mmHg     Pulse Rate 03/17/16 1004 69     Resp 03/17/16 1004 16     Temp 03/17/16 1004 98 F (36.7 C)     Temp Source 03/17/16 1004 Oral     SpO2 03/17/16 1004 97 %     Weight 03/17/16 1004 176 lb (79.833 kg)     Height 03/17/16 1004 5\' 9"  (1.753 m)     Head Cir --      Peak Flow --      Pain Score 03/17/16 1006 6     Pain Loc --      Pain Edu? --      Excl. in GC? --     Constitutional: Alert and oriented. Well appearing and in no acute distress. Eyes: Conjunctivae are normal. PERRL. EOMI. Head: Atraumatic.No ecchymosis. No swelling.   Ears: Normal external appearance bilaterally.    Nose: No congestion/rhinnorhea.  Mouth/Throat: Mucous membranes are  moist.  Oropharynx non-erythematous. Neck: No stridor.  No cervical  spine tenderness to palpation. Hematological/Lymphatic/Immunilogical: No cervical lymphadenopathy. Cardiovascular: Normal rate, regular rhythm.  Good peripheral circulation. Respiratory: Normal respiratory effort.  No retractions. Lungs CTAB. No wheezes, rales or rales.  Gastrointestinal: Soft and nontender. No distention. Normal Bowel sounds.   No CVA tenderness. Musculoskeletal: No lower or upper extremity tenderness nor edema. No midline cervical, thoracic or lumbar tenderness to palpation. Bilateral pedal pulses equal and easily palpated.  Except: Right wrist moderate tenderness to distal radius with mild to moderate swelling radial and ulnar, with mild ecchymosis, limited wrist rotation, right hand nontender, sensation intact to right distal fingers with capillary refill <2 seconds to all fingers, bilateral distal radial pulses equal and easily palpation, minimal distal ulnar tenderness, right upper extremity otherwise nontender.  Except: right posterior lateral rib along 7-9 mild tenderness to palpation with minimal ecchymosis, no palpable deformity noted, no other rib tenderness noted. Neurologic:  Normal speech and language. No gross focal neurologic deficits are appreciated. GCS 15.  Skin:  Skin is warm, dry and intact. No rash noted. Psychiatric: Mood and affect are normal. Speech and behavior are normal.  ____________________________________________   LABS (all labs ordered are listed, but only abnormal results are displayed)  Labs Reviewed - No data to display ____________________________________________  RADIOLOGY  Dg Ribs Unilateral W/chest Right  03/17/2016  CLINICAL DATA:  82 year old male who fell from lawnmower. Right posterior chest pain, pain at the angle of the shoulder blade. Initial encounter. EXAM: RIGHT RIBS AND CHEST - 3+ VIEW COMPARISON:  [facility] chest radiograph report 02/18/2016 (no images  available). Two-view chest radiographs 06/27/2013. FINDINGS: Chronic elevation of the right hemidiaphragm has not significantly changed since 2014. Stable lung volumes. Left chest dual lead cardiac pacemaker. Mediastinal contours remain stable. Calcified aortic atherosclerosis is evident. Visualized tracheal air column is within normal limits. No pneumothorax, pleural effusion or confluent pulmonary opacity. Rib marker placed at the right posterior eighth/ninth rib level. Posterior rib detail limited by osteopenia on most of the oblique views. No displaced rib fracture identified. Other visible osseous structures appear intact. IMPRESSION: 1.  No displaced right rib fracture identified. 2.  No acute cardiopulmonary abnormality. Electronically Signed   By: Odessa Fleming M.D.   On: 03/17/2016 12:13   Dg Wrist Complete Right  03/17/2016  CLINICAL DATA:  Pain following fall EXAM: RIGHT WRIST - COMPLETE 3+ VIEW COMPARISON:  None. FINDINGS: Frontal, oblique, lateral, and ulnar deviation scaphoid images were obtained. There is a comminuted fracture of the distal radial metaphysis with dorsal angulation and mild dorsal displacement distally. There is impaction at the fracture site. A small fragment extends into the radiocarpal joint. No other fractures are evident. No dislocation. There is extensive arterial vascular calcification. There is osteoarthritic change in the scaphotrapezial joint. There is also osteoarthritic change in all MCP joints and the first IP joint. IMPRESSION: Comminuted impacted fracture distal radial metaphysis with mild angulation and displacement dorsally of the distal fracture fragment. No other fracture. No dislocation. Fragment extends into the radiocarpal joint. Multilevel arthropathy. Extensive atherosclerotic vascular calcification. Electronically Signed   By: Bretta Bang III M.D.   On: 03/17/2016 10:40   Dg Hand Complete Right  03/17/2016  CLINICAL DATA:  Pain following fall 1 day prior  EXAM: RIGHT HAND - COMPLETE 3+ VIEW COMPARISON:  None. FINDINGS: Frontal, oblique, and lateral views were obtained. There is an impacted fracture of the distal radial metaphysis with a fracture fragment extending into the radiocarpal joint. There is dorsal angulation and displacement distally in  this area. No other fracture evident. No dislocations. There is moderate osteoarthritic change in all MCP, PIP, and DIP joints. There is also osteoarthritic change in the scaphotrapezial joint. There are multiple foci of arterial vascular calcification. IMPRESSION: Comminuted fracture distal radial metaphysis with dorsal angulation and mild dorsal displacement distally. There is impaction at the fracture site. No other fracture. There is multilevel arthropathy. There is extensive atherosclerotic vascular calcification. Electronically Signed   By: Bretta Bang III M.D.   On: 03/17/2016 10:39   I, Renford Dills, personally viewed and evaluated these images (plain radiographs) as part of my medical decision making, as well as reviewing the written report by the radiologist.  ____________________________________________   PROCEDURES  Procedure(s) performed: Right wrist volar and dorsal splint OCL applied by CMA. Neurovascular intact post application. ____________________________________________   INITIAL IMPRESSION / ASSESSMENT AND PLAN / ED COURSE  Pertinent labs & imaging results that were available during my care of the patient were reviewed by me and considered in my medical decision making (see chart for details).  Overall well-appearing patient. Patient with multiple comorbidities. Wife at bedside. Larey Seat off of lawnmower last night. Denies head injury or loss consciousness. Right wrist pain and right lateral ribs. No focal neurological deficits. No cervical, thoracic or lumbar tenderness to palpation. Ambulatory with cane and per patient and wife this is baseline. Will evaluate by xrays.    Right  wrist pain and swelling. Right wrist distal radial comminuted fracture, dorsal angulation and mild displacement per radiologist. Will also evaluate right rib series has mild tenderness to right lateral rib with mild ecchymosis.   Discussed patient with Dr. Trilby Drummer orthopedic who also reviewed x-ray. Dr. Rosita Kea will see patient in office 10 AM, and discussed this with patient and wife agreed to this plan. Splint applied per Dr. Rosita Kea recommendation.   Per radiologist right ribs with chest xray no displaced right rib fracture identified, no acute cardiopulmonary abnormality. Discussed xray results with patient and spouse. Patient to follow-up with orthopedic as scheduled and discussed. Rest apply ice, continue home pain medication. Denies other complaints. Follow with primary care physician this week.   Discussed follow up with Primary care physician this week. Discussed follow up and return parameters including no resolution or any worsening concerns. Patient verbalized understanding and agreed to plan.   ____________________________________________   FINAL CLINICAL IMPRESSION(S) / ED DIAGNOSES  Final diagnoses:  Right radial fracture, closed, initial encounter  Wrist contusion, right, initial encounter  Rib contusion, right, initial encounter  Fall, initial encounter     Discharge Medication List as of 03/17/2016 12:34 PM      Note: This dictation was prepared with Dragon dictation along with smaller phrase technology. Any transcriptional errors that result from this process are unintentional.       Renford Dills, NP 03/17/16 1303  Renford Dills, NP 03/17/16 1306

## 2016-04-18 ENCOUNTER — Telehealth: Payer: Self-pay

## 2016-04-18 DIAGNOSIS — R3129 Other microscopic hematuria: Secondary | ICD-10-CM

## 2016-04-18 NOTE — Telephone Encounter (Signed)
Pt wife called stating pt was supposed to RTC 2 weeks after seeing Carollee Herter in April but failed to do so. Wife stated pt has had a horrible summer and is just now getting around to following up. Wife wanted to bring pt in for repeat u/a. Pt was added to the lab schedule and orders placed.

## 2016-04-28 ENCOUNTER — Other Ambulatory Visit: Payer: Medicare Other

## 2016-04-28 DIAGNOSIS — R3129 Other microscopic hematuria: Secondary | ICD-10-CM

## 2016-04-28 LAB — URINALYSIS, COMPLETE
Bilirubin, UA: NEGATIVE
Glucose, UA: NEGATIVE
Ketones, UA: NEGATIVE
LEUKOCYTES UA: NEGATIVE
Nitrite, UA: NEGATIVE
PH UA: 5.5 (ref 5.0–7.5)
Protein, UA: NEGATIVE
RBC UA: NEGATIVE
Specific Gravity, UA: 1.025 (ref 1.005–1.030)
Urobilinogen, Ur: 2 mg/dL — ABNORMAL HIGH (ref 0.2–1.0)

## 2016-04-28 LAB — MICROSCOPIC EXAMINATION: BACTERIA UA: NONE SEEN

## 2016-05-08 IMAGING — CT CT HEAD W/O CM
1 series · 16 of 30 positions shown, 20 images · non-contrast
Comparison: 04/10/2014.

CLINICAL DATA: 86-year-old diabetic hypertensive male with double
vision for 2 months. Shaking for 2 years. No trauma. Initial
encounter.

EXAM:
CT HEAD WITHOUT CONTRAST
TECHNIQUE: Contiguous axial images were obtained from the base of the skull
through the vertex without intravenous contrast.

[Series 2: head wo · axial · 0.43mm/px · z∈[-34,+121]mm · 16 of 35 slices shown, 20 images]
[im 2/35  brain]
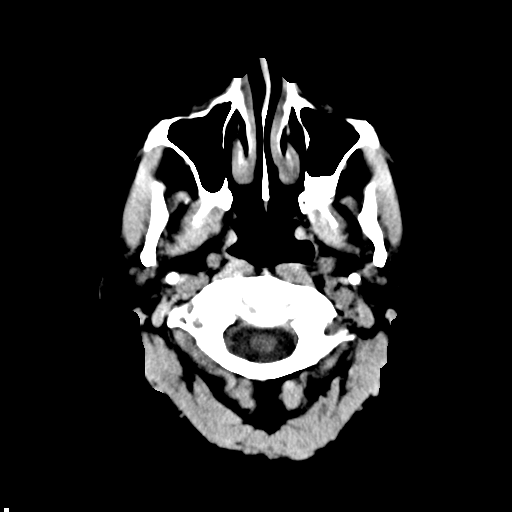
[im 2/35  bone]
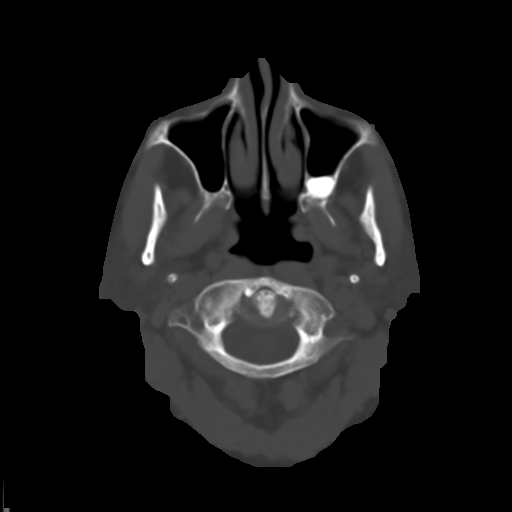
[im 4/35  brain]
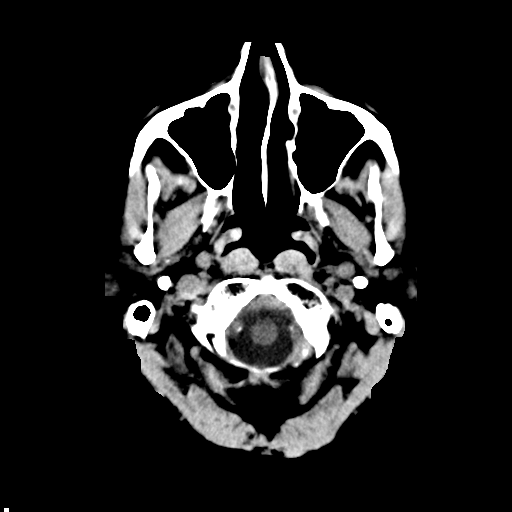
[im 6/35  brain]
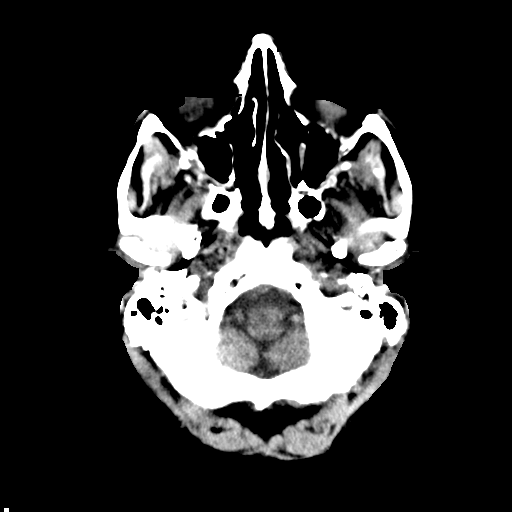
[im 9/35  brain]
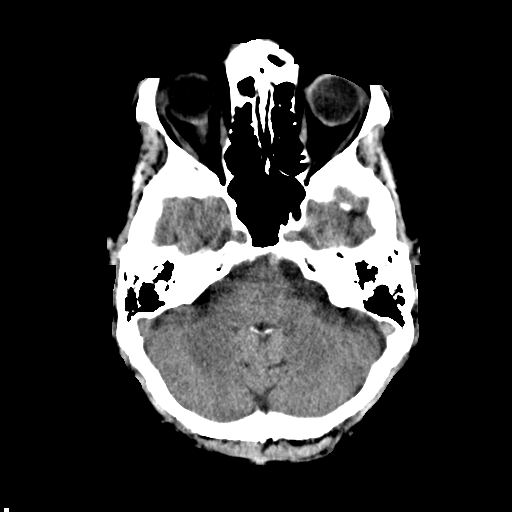
[im 10/35  brain]
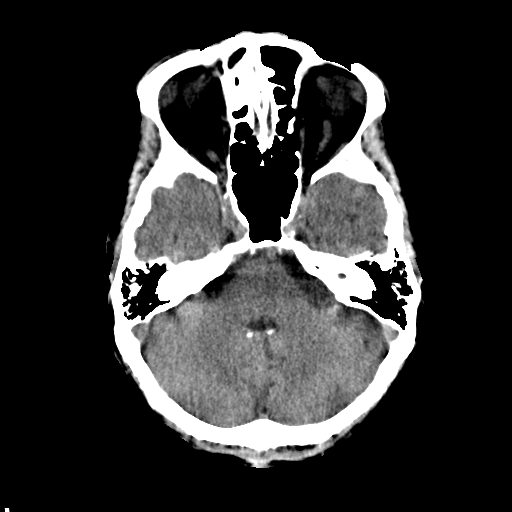
[im 10/35  bone]
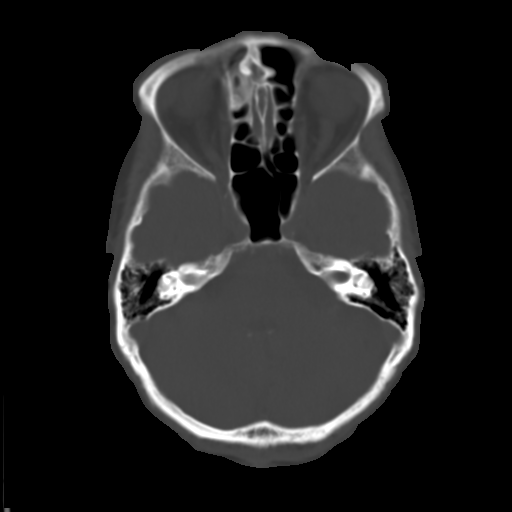
[im 12/35  brain]
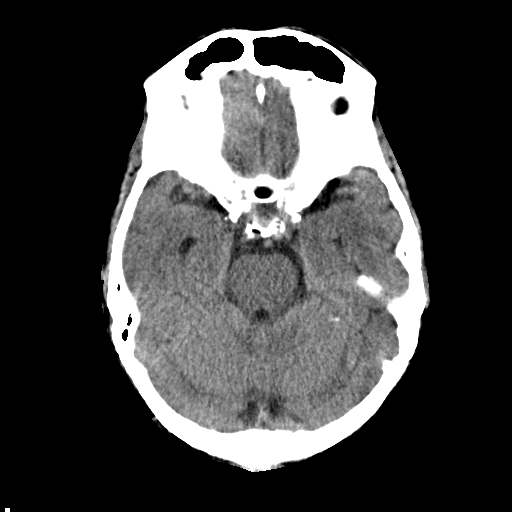
[im 15/35  brain]
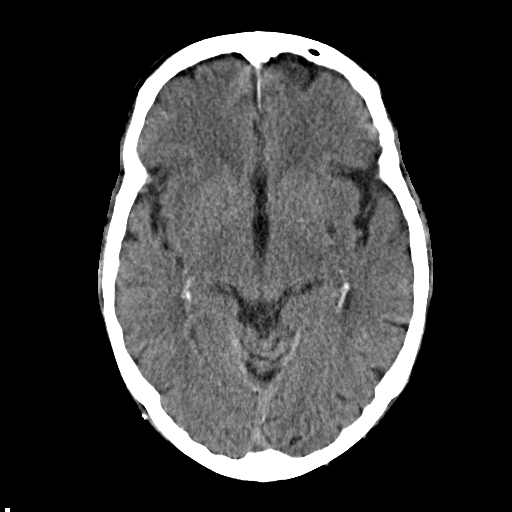
[im 17/35  brain]
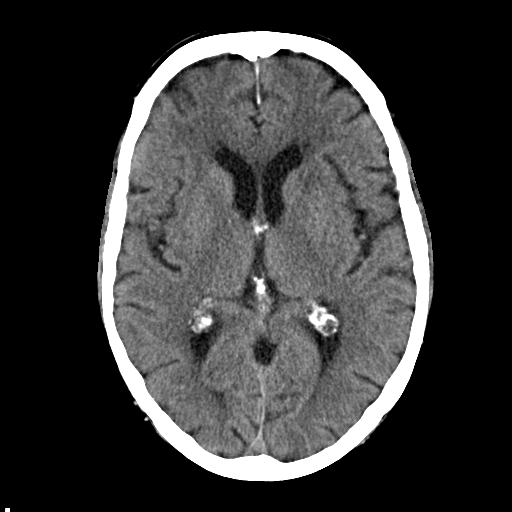
[im 18/35  brain]
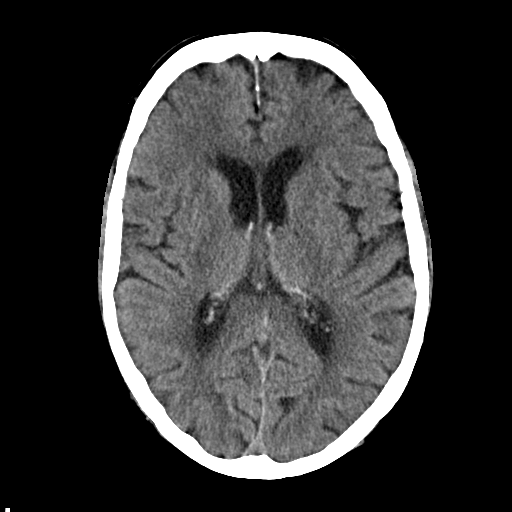
[im 18/35  bone]
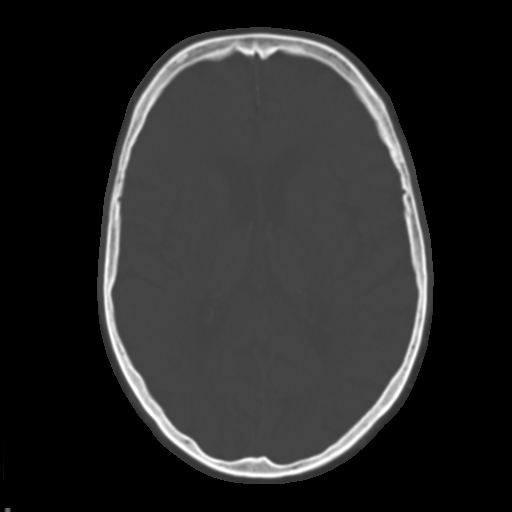
[im 20/35  brain]
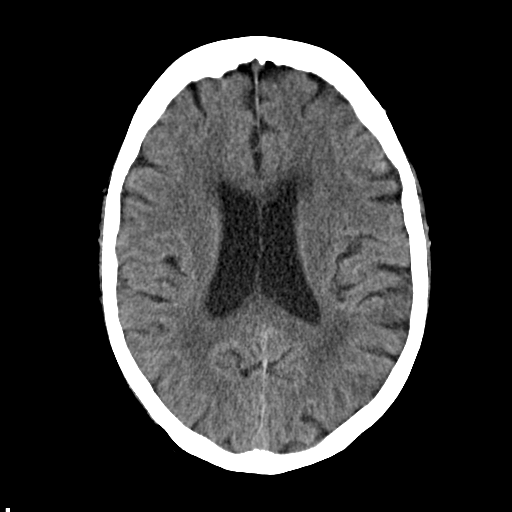
[im 23/35  brain]
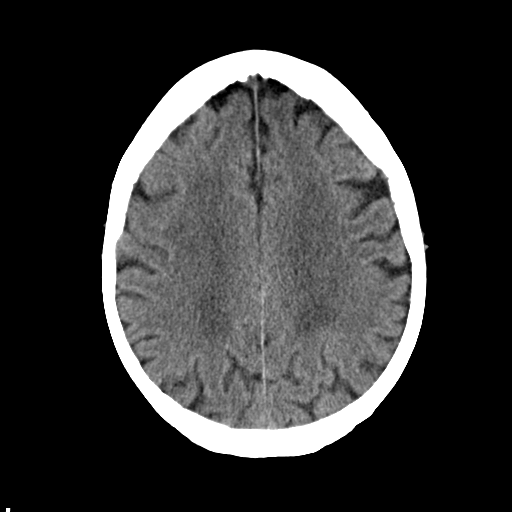
[im 25/35  brain]
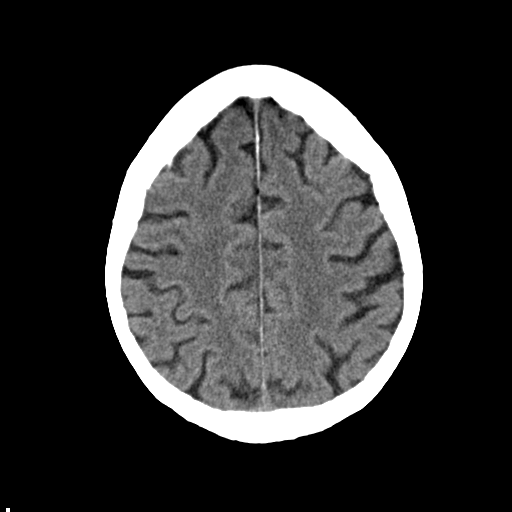
[im 26/35  brain]
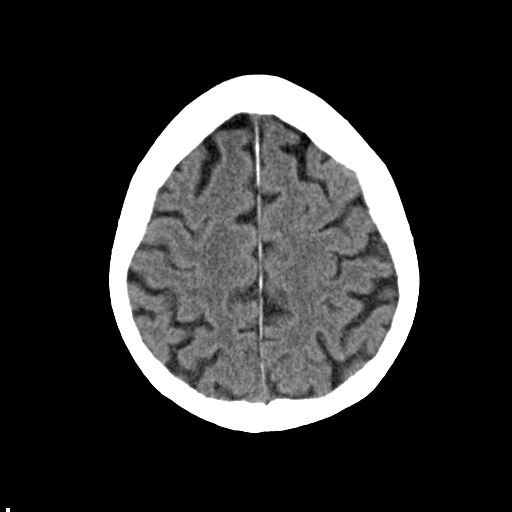
[im 26/35  bone]
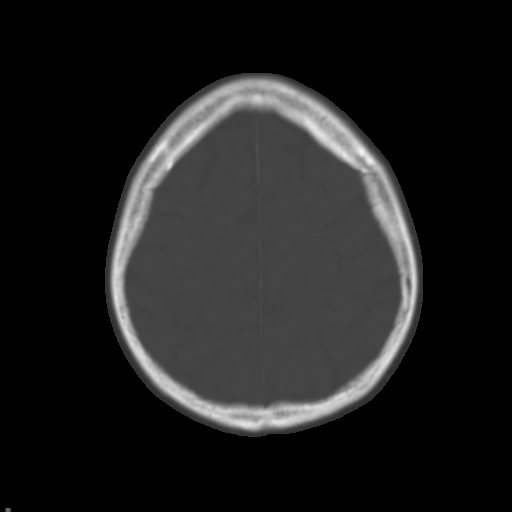
[im 29/35  brain]
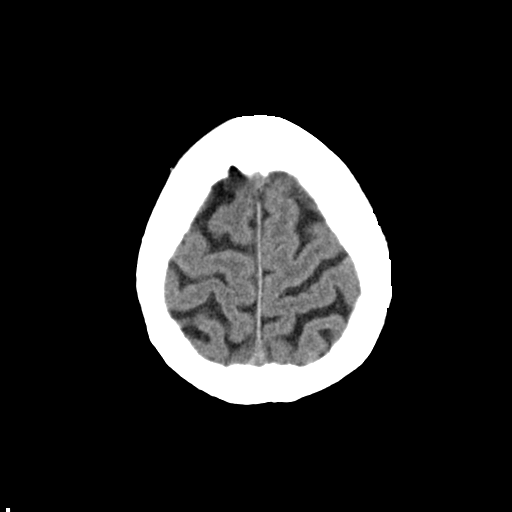
[im 31/35  brain]
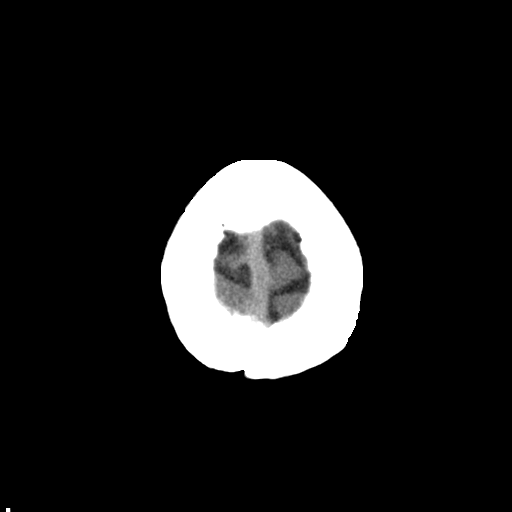
[im 33/35  brain]
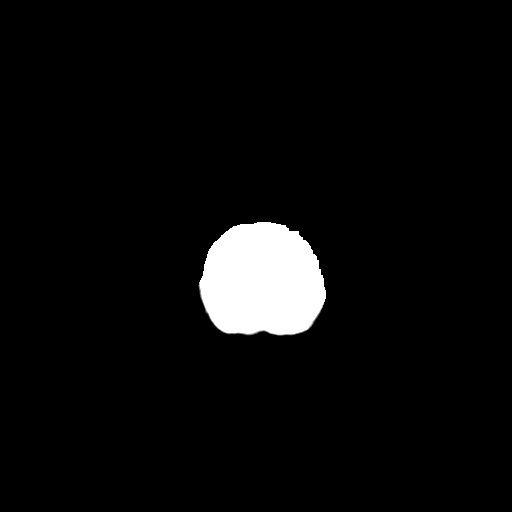

[16 of 30 positions shown; findings below may reference images not displayed]

FINDINGS: No intracranial hemorrhage.

Chronic mild to moderate small vessel disease changes without CT
evidence of large acute infarct.

Mild global atrophy typical of age without evidence hydrocephalus.

Vascular calcifications.

No intracranial mass lesion noted on this unenhanced exam.

Post lens replacement.  Orbital structures otherwise unremarkable.

Mastoid air cells, middle ear cavities and visualized paranasal
sinuses clear with exception minimal mucosal thickening ethmoid
sinus air cells.
IMPRESSION: No intracranial hemorrhage.

Mild to moderate chronic small vessel disease changes without CT
evidence of large acute infarct.

Post lens replacement otherwise orbital structures unremarkable.

## 2016-06-02 ENCOUNTER — Ambulatory Visit (INDEPENDENT_AMBULATORY_CARE_PROVIDER_SITE_OTHER): Payer: Medicare Other

## 2016-06-02 VITALS — BP 134/70 | HR 76 | Temp 97.6°F | Wt 176.7 lb

## 2016-06-02 DIAGNOSIS — N390 Urinary tract infection, site not specified: Secondary | ICD-10-CM

## 2016-06-02 LAB — URINALYSIS, COMPLETE
Bilirubin, UA: NEGATIVE
Glucose, UA: NEGATIVE
Nitrite, UA: POSITIVE — AB
PH UA: 5.5 (ref 5.0–7.5)
Specific Gravity, UA: >1.03 — ABNORMAL HIGH (ref 1.005–1.030)
Urobilinogen, Ur: 2 mg/dL — ABNORMAL HIGH (ref 0.2–1.0)

## 2016-06-02 NOTE — Progress Notes (Signed)
Pt came in today with c/o having a UTI. Pt described s/s to be hard to postpone urination, burning on urination, leakage, and foul smelling urine. A clean catch specimen was obtained for u/a and cx.   Blood pressure 134/70, pulse 76, temperature 97.6 F (36.4 C), weight 176 lb 11.2 oz (80.2 kg).

## 2016-06-05 ENCOUNTER — Telehealth: Payer: Self-pay

## 2016-06-05 ENCOUNTER — Other Ambulatory Visit: Payer: Self-pay | Admitting: Urology

## 2016-06-05 LAB — CULTURE, URINE COMPREHENSIVE

## 2016-06-05 MED ORDER — SULFAMETHOXAZOLE-TRIMETHOPRIM 800-160 MG PO TABS: 1.0000 | ORAL_TABLET | Freq: Two times a day (BID) | ORAL | 0 refills | Status: DC

## 2016-06-05 NOTE — Telephone Encounter (Signed)
LMOM

## 2016-06-05 NOTE — Telephone Encounter (Signed)
-----   Message from Harle Battiest, PA-C sent at 06/05/2016  1:04 PM EDT ----- Will you please let the patient know that I have sent a script for Bactrim to [facility].

## 2016-06-06 NOTE — Telephone Encounter (Signed)
Spoke with pt wife who stated she picked up abx yesterday.

## 2016-11-11 ENCOUNTER — Ambulatory Visit (INDEPENDENT_AMBULATORY_CARE_PROVIDER_SITE_OTHER): Payer: Medicare Other

## 2016-11-11 VITALS — BP 138/68 | HR 70 | Ht 70.0 in | Wt 171.3 lb

## 2016-11-11 DIAGNOSIS — R82998 Other abnormal findings in urine: Secondary | ICD-10-CM

## 2016-11-11 DIAGNOSIS — R8299 Other abnormal findings in urine: Secondary | ICD-10-CM | POA: Diagnosis not present

## 2016-11-11 LAB — URINALYSIS, COMPLETE
Bilirubin, UA: NEGATIVE
Glucose, UA: NEGATIVE
Ketones, UA: NEGATIVE
Leukocytes, UA: NEGATIVE
Nitrite, UA: NEGATIVE
PH UA: 5.5 (ref 5.0–7.5)
PROTEIN UA: NEGATIVE
RBC, UA: NEGATIVE
Specific Gravity, UA: 1.025 (ref 1.005–1.030)
UUROB: 4 mg/dL — AB (ref 0.2–1.0)

## 2016-11-11 LAB — MICROSCOPIC EXAMINATION: Epithelial Cells (non renal): >10 /hpf — AB (ref 0–10)

## 2016-11-11 NOTE — Progress Notes (Addendum)
Pt presents today without any UTI like s/s. Per wife pt has really dark urine and is not voiding but twice a day. Wife stated that pt only drink one 16oz bottle of water a day. Reinforced with wife that pt should be drinking 6-8 glasses of water a day and then his urine production will increase and lighten up in color. Wife voiced understanding. Wife requesting urine be ran for infection.  A clean catch was ran for u/a and cx per [facility].    Blood pressure 138/68, pulse 70, height 5\' 10"  (1.778 m), weight 171 lb 4.8 oz (77.7 kg).

## 2016-11-11 NOTE — Addendum Note (Signed)
Addended by: Skeet Latch on: 11/11/2016 01:38 PM   Modules accepted: Orders

## 2016-11-16 LAB — CULTURE, URINE COMPREHENSIVE

## 2016-11-17 ENCOUNTER — Telehealth: Payer: Self-pay

## 2016-11-17 DIAGNOSIS — N390 Urinary tract infection, site not specified: Secondary | ICD-10-CM

## 2016-11-17 MED ORDER — AMOXICILLIN-POT CLAVULANATE 875-125 MG PO TABS: 1.0000 | ORAL_TABLET | Freq: Two times a day (BID) | ORAL | 0 refills | Status: AC

## 2016-11-17 NOTE — Telephone Encounter (Signed)
-----   Message from Harle Battiest, PA-C sent at 11/16/2016  5:27 PM EDT ----- Patient has a +UCx.  They need to start Augmentin 875/125,  one tablet twice daily for seven days.  They also need to take a probiotic with the antibiotic course.

## 2016-11-17 NOTE — Telephone Encounter (Signed)
Spoke with pt wife in reference to +ucx. Made aware abx were sent to pharmacy. Pt voiced understanding.

## 2017-01-15 ENCOUNTER — Other Ambulatory Visit (INDEPENDENT_AMBULATORY_CARE_PROVIDER_SITE_OTHER): Payer: Self-pay | Admitting: Podiatry

## 2017-01-15 DIAGNOSIS — I739 Peripheral vascular disease, unspecified: Secondary | ICD-10-CM

## 2017-01-16 ENCOUNTER — Ambulatory Visit (INDEPENDENT_AMBULATORY_CARE_PROVIDER_SITE_OTHER): Payer: Medicare Other | Admitting: Vascular Surgery

## 2017-01-16 ENCOUNTER — Ambulatory Visit (INDEPENDENT_AMBULATORY_CARE_PROVIDER_SITE_OTHER): Payer: Medicare Other

## 2017-01-16 ENCOUNTER — Encounter (INDEPENDENT_AMBULATORY_CARE_PROVIDER_SITE_OTHER): Payer: Self-pay | Admitting: Vascular Surgery

## 2017-01-16 VITALS — BP 104/58 | HR 67 | Resp 15 | Ht 70.0 in | Wt 160.0 lb

## 2017-01-16 DIAGNOSIS — E785 Hyperlipidemia, unspecified: Secondary | ICD-10-CM

## 2017-01-16 DIAGNOSIS — I10 Essential (primary) hypertension: Secondary | ICD-10-CM

## 2017-01-16 DIAGNOSIS — M79604 Pain in right leg: Secondary | ICD-10-CM | POA: Diagnosis not present

## 2017-01-16 DIAGNOSIS — G20 Parkinson's disease: Secondary | ICD-10-CM

## 2017-01-16 DIAGNOSIS — E1140 Type 2 diabetes mellitus with diabetic neuropathy, unspecified: Secondary | ICD-10-CM

## 2017-01-16 DIAGNOSIS — M79605 Pain in left leg: Secondary | ICD-10-CM

## 2017-01-16 DIAGNOSIS — M79609 Pain in unspecified limb: Secondary | ICD-10-CM | POA: Insufficient documentation

## 2017-01-16 DIAGNOSIS — I739 Peripheral vascular disease, unspecified: Secondary | ICD-10-CM

## 2017-01-16 NOTE — Assessment & Plan Note (Signed)
blood pressure control important in reducing the progression of atherosclerotic disease. On appropriate oral medications.  

## 2017-01-16 NOTE — Assessment & Plan Note (Signed)
Seems to have progressed and may be contributing to his lower extremity weakness.

## 2017-01-16 NOTE — Assessment & Plan Note (Signed)
lipid control important in reducing the progression of atherosclerotic disease. Continue statin therapy  

## 2017-01-16 NOTE — Assessment & Plan Note (Signed)
Most of his lower extremity symptoms seem to be neuropathic and likely from his diabetes.

## 2017-01-16 NOTE — Progress Notes (Signed)
Patient ID: Fayrene Fearing, male   DOB: 13-May-1930, 83 y.o.   MRN: [id number]  Chief Complaint  Patient presents with  . New Evaluation    Possible PAD    HPI CHEEMENG GAWNE is a 83 y.o. male.  I am asked to see the patient by Dr. Ether Griffins for evaluation of leg pain and possible PAD.  The patient reports Worsening weakness and pain in both lower extremities. He was evaluated many years for arterial disease but this is found to be normal at that time. Over the past several years, he has had steady decline in his general health as well as his lower extremity symptoms. He is on multiple medications for the pain and discomfort in his legs. His ambulation is become markedly decreased. Both lower extremities are affected.   Past Medical History:  Diagnosis Date  . Allergic rhinitis   . Anemia   . Arthritis   . CAD (coronary artery disease)   . Diabetes mellitus with neurological manifestations (HCC)   . Episodic atrial fibrillation (HCC)   . GERD (gastroesophageal reflux disease)   . Heart block   . History of lower GI bleeding   . HLD (hyperlipidemia)   . HTN (hypertension)   . MI (myocardial infarction) (HCC)   . Proteinuria   . RLS (restless legs syndrome)   . Shingles     Past Surgical History:  Procedure Laterality Date  . CATARACT EXTRACTION    . COLONOSCOPY    . ESOPHAGOGASTRODUODENOSCOPY    . HERNIA REPAIR    . PACEMAKER PLACEMENT      Family History  Problem Relation Age of Onset  . Kidney disease Neg Hx   . Prostate cancer Neg Hx     Social History Social History  Substance Use Topics  . Smoking status: Former Smoker    Quit date: 07/31/1995  . Smokeless tobacco: Never Used  . Alcohol use No  Married  Allergies  Allergen Reactions  . Atorvastatin     Other reaction(s): Other (See Comments) Restless leg syndrome  . Esomeprazole Magnesium     Other reaction(s): Other (See Comments) Stomach irritation    Current Outpatient Prescriptions    Medication Sig Dispense Refill  . amiodarone (PACERONE) 200 MG tablet Take by mouth.    Marland Kitchen apixaban (ELIQUIS) 5 MG TABS tablet Take by mouth.    . doxepin (SINEQUAN) 75 MG capsule     . fluticasone (FLONASE) 50 MCG/ACT nasal spray Place into the nose.    . latanoprost (XALATAN) 0.005 % ophthalmic solution Apply to eye.    Marland Kitchen LYRICA 150 MG capsule     . metFORMIN (GLUCOPHAGE) 500 MG tablet     . nitrofurantoin, macrocrystal-monohydrate, (MACROBID) 100 MG capsule Reported on 12/10/2015    . nitroGLYCERIN (NITROSTAT) 0.4 MG SL tablet Place under the tongue.    Marland Kitchen Omeprazole 20 MG TBEC Take by mouth.    . oxyCODONE (OXY IR/ROXICODONE) 5 MG immediate release tablet 1 po tid prn Earliest Fill Date: 07/25/15    . pramipexole (MIRAPEX) 1 MG tablet     . pravastatin (PRAVACHOL) 40 MG tablet Take by mouth.    . pregabalin (LYRICA) 100 MG capsule 1 po qHS    . carbidopa-levodopa (SINEMET IR) 25-100 MG tablet Take by mouth.    . pramipexole (MIRAPEX) 1 MG tablet Take by mouth.     No current facility-administered medications for this visit.       REVIEW OF SYSTEMS (Negative  unless checked)  Constitutional: [] Weight loss  [] Fever  [] Chills Cardiac: [] Chest pain   [] Chest pressure   [] Palpitations   [] Shortness of breath when laying flat   [] Shortness of breath at rest   [] Shortness of breath with exertion. Vascular:  [x] Pain in legs with walking   [x] Pain in legs at rest   [] Pain in legs when laying flat   [] Claudication   [x] Pain in feet when walking  [x] Pain in feet at rest  [] Pain in feet when laying flat   [] History of DVT   [] Phlebitis   [x] Swelling in legs   [] Varicose veins   [] Non-healing ulcers Pulmonary:   [] Uses home oxygen   [] Productive cough   [] Hemoptysis   [] Wheeze  [] COPD   [] Asthma Neurologic:  [] Dizziness  [] Blackouts   [] Seizures   [] History of stroke   [] History of TIA  [] Aphasia   [] Temporary blindness   [] Dysphagia   [] Weakness or numbness in arms   [] Weakness or numbness in  legs Musculoskeletal:  [x] Arthritis   [] Joint swelling   [] Joint pain   [] Low back pain Hematologic:  [] Easy bruising  [] Easy bleeding   [] Hypercoagulable state   [] Anemic  [] Hepatitis Gastrointestinal:  [] Blood in stool   [] Vomiting blood  [] Gastroesophageal reflux/heartburn   [] Abdominal pain Genitourinary:  [] Chronic kidney disease   [] Difficult urination  [] Frequent urination  [] Burning with urination   [] Hematuria Skin:  [] Rashes   [] Ulcers   [] Wounds Psychological:  [] History of anxiety   []  History of major depression.    Physical Exam BP (!) 104/58 (BP Location: Right Arm)   Pulse 67   Resp 15   Ht 5\' 10"  (1.778 m)   Wt 160 lb (72.6 kg)   BMI 22.96 kg/m  Gen:  WD/WN, NAD Head: [place]/AT, No temporalis wasting.  Ear/Nose/Throat: Hearing grossly intact, nares w/o erythema or drainage, oropharynx w/o Erythema/Exudate Eyes: Conjunctiva clear, sclera non-icteric  Neck: trachea midline.  No JVD.  Pulmonary:  Good air movement, no use of accessory muscles, respirations not labored Cardiac: Irregular Vascular:  Vessel Right Left  Radial Palpable Palpable  Ulnar Palpable Palpable  Brachial Palpable Palpable  Carotid Palpable, without bruit Palpable, without bruit  Aorta Not palpable N/A  Femoral Palpable Palpable  Popliteal Palpable Palpable  PT 1+ Palpable Not Palpable  DP 1+ Palpable 1+ Palpable   Gastrointestinal: soft, non-tender/non-distended.  Musculoskeletal: M/S 5/5 throughout.  Symmetric. Right lower extremity with trace edema and 1-2+ left lower extremity edema is present. Stasis changes are present worse on the left leg than the right leg. Neurologic: Sensation diminished in extremities.  Symmetrical.  Speech is fluent. Motor exam as listed above. Psychiatric: Judgment seems fair. Wife provides much of the history. Dermatologic: No rashes or ulcers noted.  No cellulitis or open wounds.    Radiology No results found.  Labs Recent Results (from the past 2160  hour(s))  Urinalysis, Complete     Status: Abnormal   Collection Time: 11/11/16 11:11 AM  Result Value Ref Range   Specific Gravity, UA 1.025 1.005 - 1.030   pH, UA 5.5 5.0 - 7.5   Color, UA Yellow Yellow   Appearance Ur Cloudy (A) Clear   Leukocytes, UA Negative Negative   Protein, UA Negative Negative/Trace   Glucose, UA Negative Negative   Ketones, UA Negative Negative   RBC, UA Negative Negative   Bilirubin, UA Negative Negative   Urobilinogen, Ur 4.0 (H) 0.2 - 1.0 mg/dL   Nitrite, UA Negative Negative   Microscopic Examination  See below:   Microscopic Examination     Status: Abnormal   Collection Time: 11/11/16 11:11 AM  Result Value Ref Range   WBC, UA 0-5 0 - 5 /hpf   RBC, UA 3-10 (A) 0 - 2 /hpf   Epithelial Cells (non renal) >10 (A) 0 - 10 /hpf   Bacteria, UA Few None seen/Few  CULTURE, URINE COMPREHENSIVE     Status: Abnormal   Collection Time: 11/11/16  3:17 PM  Result Value Ref Range   Urine Culture, Comprehensive Final report (A)    Result 1 Enterococcus faecalis (A)     Comment: 50,000-100,000 colony forming units per mL Note: this isolate is vancomycin-susceptible. This information is provided for epidemiologic purposes only: vancomycin is not among the antibiotics recommended for therapy of urinary tract infections caused by Enterococcus.    Result 2 Comment     Comment: Mixed urogenital flora 10,000-25,000 colony forming units per mL    ANTIMICROBIAL SUSCEPTIBILITY Comment     Comment:       ** S = Susceptible; I = Intermediate; R = Resistant **                    P = Positive; N = Negative             MICS are expressed in micrograms per mL    Antibiotic                 RSLT#1    RSLT#2    RSLT#3    RSLT#4 Ciprofloxacin                  S Levofloxacin                   S Nitrofurantoin                 S Penicillin                     S Tetracycline                   R Vancomycin                     S     Assessment/Plan:  HLD  (hyperlipidemia) lipid control important in reducing the progression of atherosclerotic disease. Continue statin therapy   Parkinson's disease (HCC) Seems to have progressed and may be contributing to his lower extremity weakness.  Diabetes mellitus (HCC) Most of his lower extremity symptoms seem to be neuropathic and likely from his diabetes.  Benign essential HTN blood pressure control important in reducing the progression of atherosclerotic disease. On appropriate oral medications.   Pain in limb We perform noninvasive studies today to evaluate him for peripheral arterial disease as a cause or contribute factor to his lower extremity symptoms. His right lower extremity was noncompressible with an ABI of greater than 1.3 and his left ABI was 1.1, but his waveforms are brisk and triphasic throughout both lower extremities and he has reasonably good digit pressures bilaterally consistent with no arterial insufficiency. This does not appear to be the cause of his lower extremity symptoms. I will see him back when necessary.      Festus Barren 01/16/2017, 12:25 PM   This note was created with Dragon medical transcription system.  Any errors from dictation are unintentional.

## 2017-01-16 NOTE — Assessment & Plan Note (Signed)
We perform noninvasive studies today to evaluate him for peripheral arterial disease as a cause or contribute factor to his lower extremity symptoms. His right lower extremity was noncompressible with an ABI of greater than 1.3 and his left ABI was 1.1, but his waveforms are brisk and triphasic throughout both lower extremities and he has reasonably good digit pressures bilaterally consistent with no arterial insufficiency. This does not appear to be the cause of his lower extremity symptoms. I will see him back when necessary.

## 2017-01-26 ENCOUNTER — Inpatient Hospital Stay: Payer: Medicare Other

## 2017-01-26 ENCOUNTER — Encounter: Payer: Self-pay | Admitting: Emergency Medicine

## 2017-01-26 ENCOUNTER — Emergency Department: Payer: Medicare Other

## 2017-01-26 ENCOUNTER — Inpatient Hospital Stay
Admission: EM | Admit: 2017-01-26 | Discharge: 2017-01-30 | DRG: 871 | Disposition: A | Discharge status: Home / Self Care | Payer: Medicare Other | Attending: Internal Medicine | Admitting: Internal Medicine

## 2017-01-26 DIAGNOSIS — Z87891 Personal history of nicotine dependence: Secondary | ICD-10-CM | POA: Diagnosis not present

## 2017-01-26 DIAGNOSIS — K219 Gastro-esophageal reflux disease without esophagitis: Secondary | ICD-10-CM | POA: Diagnosis present

## 2017-01-26 DIAGNOSIS — R109 Unspecified abdominal pain: Secondary | ICD-10-CM

## 2017-01-26 DIAGNOSIS — I2510 Atherosclerotic heart disease of native coronary artery without angina pectoris: Secondary | ICD-10-CM | POA: Diagnosis present

## 2017-01-26 DIAGNOSIS — G2581 Restless legs syndrome: Secondary | ICD-10-CM | POA: Diagnosis present

## 2017-01-26 DIAGNOSIS — Z950 Presence of cardiac pacemaker: Secondary | ICD-10-CM

## 2017-01-26 DIAGNOSIS — Z9109 Other allergy status, other than to drugs and biological substances: Secondary | ICD-10-CM | POA: Diagnosis not present

## 2017-01-26 DIAGNOSIS — A419 Sepsis, unspecified organism: Principal | ICD-10-CM

## 2017-01-26 DIAGNOSIS — Z7189 Other specified counseling: Secondary | ICD-10-CM

## 2017-01-26 DIAGNOSIS — Z515 Encounter for palliative care: Secondary | ICD-10-CM | POA: Diagnosis present

## 2017-01-26 DIAGNOSIS — E43 Unspecified severe protein-calorie malnutrition: Secondary | ICD-10-CM | POA: Diagnosis present

## 2017-01-26 DIAGNOSIS — R4182 Altered mental status, unspecified: Secondary | ICD-10-CM

## 2017-01-26 DIAGNOSIS — I110 Hypertensive heart disease with heart failure: Secondary | ICD-10-CM | POA: Diagnosis present

## 2017-01-26 DIAGNOSIS — I252 Old myocardial infarction: Secondary | ICD-10-CM

## 2017-01-26 DIAGNOSIS — K7200 Acute and subacute hepatic failure without coma: Secondary | ICD-10-CM | POA: Diagnosis present

## 2017-01-26 DIAGNOSIS — Z79899 Other long term (current) drug therapy: Secondary | ICD-10-CM | POA: Diagnosis not present

## 2017-01-26 DIAGNOSIS — R918 Other nonspecific abnormal finding of lung field: Secondary | ICD-10-CM | POA: Diagnosis not present

## 2017-01-26 DIAGNOSIS — R401 Stupor: Secondary | ICD-10-CM | POA: Diagnosis not present

## 2017-01-26 DIAGNOSIS — Z87440 Personal history of urinary (tract) infections: Secondary | ICD-10-CM

## 2017-01-26 DIAGNOSIS — J189 Pneumonia, unspecified organism: Secondary | ICD-10-CM | POA: Diagnosis present

## 2017-01-26 DIAGNOSIS — N179 Acute kidney failure, unspecified: Secondary | ICD-10-CM | POA: Diagnosis present

## 2017-01-26 DIAGNOSIS — E785 Hyperlipidemia, unspecified: Secondary | ICD-10-CM | POA: Diagnosis present

## 2017-01-26 DIAGNOSIS — J9601 Acute respiratory failure with hypoxia: Secondary | ICD-10-CM | POA: Diagnosis not present

## 2017-01-26 DIAGNOSIS — Z7901 Long term (current) use of anticoagulants: Secondary | ICD-10-CM

## 2017-01-26 DIAGNOSIS — J9600 Acute respiratory failure, unspecified whether with hypoxia or hypercapnia: Secondary | ICD-10-CM

## 2017-01-26 DIAGNOSIS — E1149 Type 2 diabetes mellitus with other diabetic neurological complication: Secondary | ICD-10-CM | POA: Diagnosis present

## 2017-01-26 DIAGNOSIS — Z6822 Body mass index (BMI) 22.0-22.9, adult: Secondary | ICD-10-CM

## 2017-01-26 DIAGNOSIS — J9690 Respiratory failure, unspecified, unspecified whether with hypoxia or hypercapnia: Secondary | ICD-10-CM

## 2017-01-26 DIAGNOSIS — G20 Parkinson's disease: Secondary | ICD-10-CM | POA: Diagnosis present

## 2017-01-26 DIAGNOSIS — Z7984 Long term (current) use of oral hypoglycemic drugs: Secondary | ICD-10-CM

## 2017-01-26 DIAGNOSIS — I509 Heart failure, unspecified: Secondary | ICD-10-CM | POA: Diagnosis present

## 2017-01-26 DIAGNOSIS — R748 Abnormal levels of other serum enzymes: Secondary | ICD-10-CM

## 2017-01-26 DIAGNOSIS — Z66 Do not resuscitate: Secondary | ICD-10-CM | POA: Diagnosis present

## 2017-01-26 DIAGNOSIS — I4891 Unspecified atrial fibrillation: Secondary | ICD-10-CM | POA: Diagnosis present

## 2017-01-26 HISTORY — DX: Parkinson's disease without dyskinesia, without mention of fluctuations: G20.A1

## 2017-01-26 HISTORY — DX: Parkinson's disease: G20

## 2017-01-26 LAB — URINALYSIS, ROUTINE W REFLEX MICROSCOPIC
Bilirubin Urine: NEGATIVE
Glucose, UA: NEGATIVE mg/dL
Ketones, ur: NEGATIVE mg/dL
Leukocytes, UA: NEGATIVE
Nitrite: NEGATIVE
Protein, ur: 100 mg/dL — AB
Specific Gravity, Urine: 1.021 (ref 1.005–1.030)
pH: 5 (ref 5.0–8.0)

## 2017-01-26 LAB — BLOOD GAS, VENOUS
Acid-base deficit: 3.3 mmol/L — ABNORMAL HIGH (ref 0.0–2.0)
BICARBONATE: 21.3 mmol/L (ref 20.0–28.0)
O2 Saturation: 88.1 %
PH VEN: 7.38 (ref 7.250–7.430)
Patient temperature: 37
pCO2, Ven: 36 mmHg — ABNORMAL LOW (ref 44.0–60.0)
pO2, Ven: 56 mmHg — ABNORMAL HIGH (ref 32.0–45.0)

## 2017-01-26 LAB — COMPREHENSIVE METABOLIC PANEL
ALT: 1171 U/L — ABNORMAL HIGH (ref 17–63)
ANION GAP: 14 (ref 5–15)
AST: 3169 U/L — ABNORMAL HIGH (ref 15–41)
Albumin: 3.5 g/dL (ref 3.5–5.0)
Alkaline Phosphatase: 58 U/L (ref 38–126)
BUN: 65 mg/dL — ABNORMAL HIGH (ref 6–20)
CHLORIDE: 105 mmol/L (ref 101–111)
CO2: 21 mmol/L — AB (ref 22–32)
Calcium: 8.5 mg/dL — ABNORMAL LOW (ref 8.9–10.3)
Creatinine, Ser: 2.38 mg/dL — ABNORMAL HIGH (ref 0.61–1.24)
GFR, EST AFRICAN AMERICAN: 27 mL/min — AB (ref >60)
GFR, EST NON AFRICAN AMERICAN: 23 mL/min — AB (ref >60)
Glucose, Bld: 183 mg/dL — ABNORMAL HIGH (ref 65–99)
POTASSIUM: 5.2 mmol/L — AB (ref 3.5–5.1)
SODIUM: 140 mmol/L (ref 135–145)
Total Bilirubin: 3 mg/dL — ABNORMAL HIGH (ref 0.3–1.2)
Total Protein: 6.7 g/dL (ref 6.5–8.1)

## 2017-01-26 LAB — CBC WITH DIFFERENTIAL/PLATELET
Basophils Absolute: 0 10*3/uL (ref 0–0.1)
Basophils Relative: 0 %
Eosinophils Absolute: 0 10*3/uL (ref 0–0.7)
Eosinophils Relative: 0 %
HEMATOCRIT: 35.4 % — AB (ref 40.0–52.0)
Hemoglobin: 11.4 g/dL — ABNORMAL LOW (ref 13.0–18.0)
LYMPHS PCT: 3 %
Lymphs Abs: 0.4 10*3/uL — ABNORMAL LOW (ref 1.0–3.6)
MCH: 26.2 pg (ref 26.0–34.0)
MCHC: 32.2 g/dL (ref 32.0–36.0)
MCV: 81.4 fL (ref 80.0–100.0)
MONO ABS: 0.6 10*3/uL (ref 0.2–1.0)
MONOS PCT: 6 %
NEUTROS ABS: 10 10*3/uL — AB (ref 1.4–6.5)
Neutrophils Relative %: 91 %
Platelets: 126 10*3/uL — ABNORMAL LOW (ref 150–440)
RBC: 4.35 MIL/uL — ABNORMAL LOW (ref 4.40–5.90)
RDW: 16.1 % — AB (ref 11.5–14.5)
WBC: 11 10*3/uL — ABNORMAL HIGH (ref 3.8–10.6)

## 2017-01-26 LAB — GLUCOSE, CAPILLARY
GLUCOSE-CAPILLARY: 221 mg/dL — AB (ref 65–99)
Glucose-Capillary: 209 mg/dL — ABNORMAL HIGH (ref 65–99)
Glucose-Capillary: 199 mg/dL — ABNORMAL HIGH (ref 65–99)

## 2017-01-26 LAB — PROCALCITONIN: Procalcitonin: 4.1 ng/mL

## 2017-01-26 LAB — LACTIC ACID, PLASMA
LACTIC ACID, VENOUS: 4.1 mmol/L — AB (ref 0.5–1.9)
LACTIC ACID, VENOUS: 3.6 mmol/L — AB (ref 0.5–1.9)

## 2017-01-26 LAB — TSH: TSH: 0.623 u[IU]/mL (ref 0.350–4.500)

## 2017-01-26 MED ORDER — APIXABAN 2.5 MG PO TABS
2.5000 mg | ORAL_TABLET | Freq: Two times a day (BID) | ORAL | Status: DC
  Administered 2017-01-26 – 2017-01-27 (×2): 2.5 mg via ORAL
  Filled 2017-01-26 (×2): qty 1

## 2017-01-26 MED ORDER — ONDANSETRON HCL 4 MG/2ML IJ SOLN
4.0000 mg | Freq: Four times a day (QID) | INTRAMUSCULAR | Status: DC | PRN
  Administered 2017-01-29 – 2017-01-30 (×2): 4 mg via INTRAVENOUS
  Filled 2017-01-26 (×2): qty 2

## 2017-01-26 MED ORDER — PANTOPRAZOLE SODIUM 40 MG PO TBEC
40.0000 mg | DELAYED_RELEASE_TABLET | Freq: Every day | ORAL | Status: DC
  Administered 2017-01-26 – 2017-01-30 (×5): 40 mg via ORAL
  Filled 2017-01-26 (×5): qty 1

## 2017-01-26 MED ORDER — SODIUM CHLORIDE 0.9 % IV SOLN
INTRAVENOUS | Status: DC
  Administered 2017-01-26: 20:00:00 via INTRAVENOUS

## 2017-01-26 MED ORDER — CEFTRIAXONE SODIUM IN DEXTROSE 20 MG/ML IV SOLN: 1.0000 g | INTRAVENOUS | Status: DC

## 2017-01-26 MED ORDER — NOREPINEPHRINE BITARTRATE 1 MG/ML IV SOLN
0.0000 ug/min | INTRAVENOUS | Status: DC
Start: 2017-01-26 — End: 2017-01-27
  Filled 2017-01-26: qty 4

## 2017-01-26 MED ORDER — FLUTICASONE PROPIONATE 50 MCG/ACT NA SUSP
1.0000 | Freq: Every day | NASAL | Status: DC
  Administered 2017-01-26: 1 via NASAL
  Filled 2017-01-26: qty 16

## 2017-01-26 MED ORDER — SODIUM CHLORIDE 0.9 % IV BOLUS (SEPSIS)
1000.0000 mL | Freq: Once | INTRAVENOUS | Status: AC
  Administered 2017-01-26: 1000 mL via INTRAVENOUS

## 2017-01-26 MED ORDER — DEXTROSE 5 % IV SOLN
500.0000 mg | Freq: Once | INTRAVENOUS | Status: AC
  Administered 2017-01-26: 500 mg via INTRAVENOUS
  Filled 2017-01-26: qty 500

## 2017-01-26 MED ORDER — AZITHROMYCIN 500 MG IV SOLR
500.0000 mg | INTRAVENOUS | Status: DC
  Filled 2017-01-26: qty 500

## 2017-01-26 MED ORDER — INSULIN ASPART 100 UNIT/ML ~~LOC~~ SOLN
0.0000 [IU] | Freq: Three times a day (TID) | SUBCUTANEOUS | Status: DC
  Administered 2017-01-27 (×2): 1 [IU] via SUBCUTANEOUS
  Filled 2017-01-26 (×2): qty 1

## 2017-01-26 MED ORDER — DEXTROSE 5 % IV SOLN
1.0000 g | INTRAVENOUS | Status: DC
  Filled 2017-01-26: qty 10

## 2017-01-26 MED ORDER — OXYCODONE HCL 5 MG PO TABS
5.0000 mg | ORAL_TABLET | ORAL | Status: DC | PRN
  Administered 2017-01-26 – 2017-01-29 (×6): 5 mg via ORAL
  Filled 2017-01-26 (×6): qty 1

## 2017-01-26 MED ORDER — LATANOPROST 0.005 % OP SOLN
1.0000 [drp] | Freq: Every day | OPHTHALMIC | Status: DC
  Administered 2017-01-26 – 2017-01-29 (×4): 1 [drp] via OPHTHALMIC
  Filled 2017-01-26 (×2): qty 2.5

## 2017-01-26 MED ORDER — ONDANSETRON HCL 4 MG PO TABS: 4.0000 mg | ORAL_TABLET | Freq: Four times a day (QID) | ORAL | Status: DC | PRN

## 2017-01-26 MED ORDER — CEFTRIAXONE SODIUM IN DEXTROSE 20 MG/ML IV SOLN
1.0000 g | Freq: Once | INTRAVENOUS | Status: AC
  Administered 2017-01-26: 1 g via INTRAVENOUS
  Filled 2017-01-26: qty 50

## 2017-01-26 MED ORDER — IPRATROPIUM-ALBUTEROL 0.5-2.5 (3) MG/3ML IN SOLN: 3.0000 mL | Freq: Four times a day (QID) | RESPIRATORY_TRACT | Status: DC | PRN

## 2017-01-26 NOTE — Progress Notes (Signed)
eLink Physician-Brief Progress Note Patient Name: Johnny Bowen DOB: 1930/05/11 MRN: [id number]   Date of Service  01/26/2017  HPI/Events of Note  New patient evaluation.  83 year old male, known Parkinson's disease, comes in with altered mental status and hypotension. Chest x-ray suggestive of pneumonia. LFTs in the thousands. Also with acute kidney injury. He received 3 L saline at the ER and was started on pressors. He is a full DO NOT RESUSCITATE.   Patient seen, not in distress. Comfortable.  Blood pressure 107/74, heart rate 82, respiratory rate 26, O2 saturation 96 on nasal cannula.   eICU Interventions  Cont current management     Intervention Category Evaluation Type: New Patient Evaluation  Louann Sjogren 01/26/2017, 7:18 PM

## 2017-01-26 NOTE — Consult Note (Signed)
 Name: Johnny Bowen MRN: [id number] DOB: 08/30/1930    ADMISSION DATE:  01/26/2017 CONSULTATION DATE: 01/26/2017  REFERRING MD :  Dr. Patel  CHIEF COMPLAINT: Altered mental status   BRIEF PATIENT DESCRIPTION:  83 yo male admitted 05/21 with acute hypoxic respiratory failure secondary to ?CAP, altered mental status, elevated liver enzymes, and septic shock with hypotension  SIGNIFICANT EVENTS  05/21-Pt admitted to Stepdown Unit   STUDIES:  US of Abdomen 05/21>>Diffuse gallbladder wall thickening, measuring up to 6 mm, with mild echogenic sludge. No ultrasonographic Murphy's sign elicited. This may reflect chronic inflammation. Small right pleural effusion  HISTORY OF PRESENT ILLNESS:   This is an 83 yo male with a PMH of Restless Leg Syndrome, Proteinuria, Parkinson Disease, Myocardial Infarction, HTN, Hyperlipidemia, GERD, Heart Block, Episodic Atrial Fibrillation, Diabetes Mellitus, CAD, Arthritis, and Anemia.  He presented to [facility] ER 05/21 with altered mental status, poor po intake, and shortness of breath onset of symptoms over the last several days.  Per ER notes according to EMS the pt was hypoxic upon EMS arrival, therefore he was placed on nasal canula and given duoneb treatment and solumedrol.  In the ER the pt was febrile with leukocytosis, hypotensive, and cxr suspicious for pneumonia therefore pt met sepsis criteria receiving 2L NS bolus.  Lab results also revealed lactic acid 4.1, AST 3,169,  ALT 1,171, and total bilirubin 3.0.  He was subsequently admitted to the Stepdown Unit for further workup and management.  PAST MEDICAL HISTORY :   has a past medical history of Allergic rhinitis; Anemia; Arthritis; CAD (coronary artery disease); Diabetes mellitus with neurological manifestations (HCC); Episodic atrial fibrillation (HCC); GERD (gastroesophageal reflux disease); Heart block; History of lower GI bleeding; HLD (hyperlipidemia); HTN (hypertension); MI (myocardial infarction)  (HCC); Parkinson disease (HCC); Proteinuria; RLS (restless legs syndrome); and Shingles.  has a past surgical history that includes Hernia repair; pacemaker placement; Colonoscopy; Esophagogastroduodenoscopy; and Cataract extraction. Prior to Admission medications   Medication Sig Start Date End Date Taking? Authorizing Provider  amiodarone (PACERONE) 200 MG tablet Take 100 mg by mouth 2 (two) times daily.  08/08/15  Yes [provider]  apixaban (ELIQUIS) 5 MG TABS tablet Take 5 mg by mouth 2 (two) times daily.  09/11/14  Yes [provider]  doxepin (SINEQUAN) 75 MG capsule 150 mg at bedtime as needed.  08/09/15  Yes [provider]  fluticasone (FLONASE) 50 MCG/ACT nasal spray Place 1 spray into both nostrils daily.  03/07/15  Yes [provider]  latanoprost (XALATAN) 0.005 % ophthalmic solution Place 1 drop into both eyes at bedtime.  01/07/15  Yes [provider]  metFORMIN (GLUCOPHAGE) 500 MG tablet Take 500 mg by mouth 2 (two) times daily with a meal.  07/26/15  Yes [provider]  omeprazole (PRILOSEC) 20 MG capsule Take 20 mg by mouth daily.   Yes [provider]  pramipexole (MIRAPEX) 1 MG tablet Take 2 mg by mouth 2 (two) times daily.  10/31/15  Yes [provider]  pravastatin (PRAVACHOL) 40 MG tablet Take 40 mg by mouth daily.  03/07/15  Yes [provider]  pregabalin (LYRICA) 100 MG capsule 1 po qHS 02/21/15  Yes [provider]  carbidopa-levodopa (SINEMET IR) 25-100 MG tablet Take 1 tablet by mouth 3 (three) times daily.  05/22/15 03/17/16  [provider]  nitroGLYCERIN (NITROSTAT) 0.4 MG SL tablet Place 0.4 mg under the tongue every 5 (five) minutes as needed.     [provider]  oxyCODONE (OXY IR/ROXICODONE) 5 MG immediate release tablet 1 po tid prn Earliest Fill Date: 07/25/15 07/25/15   [provider]  pramipexole (MIRAPEX) 1 MG tablet Take 2 mg by mouth 2 (two) times  daily.  05/22/15 08/20/15  [provider]   Allergies  Allergen Reactions  . Atorvastatin     Other reaction(s): Other (See Comments) Restless leg syndrome  . Esomeprazole Magnesium     Other reaction(s): Other (See Comments) Stomach irritation    FAMILY HISTORY:  family history is not on file. SOCIAL HISTORY:  reports that he quit smoking about 21 years ago. He has never used smokeless tobacco. He reports that he does not drink alcohol or use drugs.  REVIEW OF SYSTEMS:  Positives in BOLD  Constitutional: fever, confusion, chills, weight loss, malaise/fatigue and diaphoresis.  HENT: Negative for hearing loss, ear pain, nosebleeds, congestion, sore throat, neck pain, tinnitus and ear discharge.   Eyes: Negative for blurred vision, double vision, photophobia, pain, discharge and redness.  Respiratory: cough, hemoptysis, sputum production, shortness of breath, wheezing and stridor.   Cardiovascular: Negative for chest pain, palpitations, orthopnea, claudication, leg swelling and PND.  Gastrointestinal: Negative for heartburn, nausea, vomiting, abdominal pain, diarrhea, constipation, blood in stool and melena.  Genitourinary: Negative for dysuria, urgency, frequency, hematuria and flank pain.  Musculoskeletal: Negative for myalgias, back pain, joint pain and falls.  Skin: Negative for itching and rash.  Neurological: dizziness, tingling, tremors, sensory change, speech change, focal weakness, seizures, loss of consciousness, weakness and headaches.  Endo/Heme/Allergies: Negative for environmental allergies and polydipsia. Does not bruise/bleed easily.  SUBJECTIVE:  Pt resting in bed no complaints at this time.  VITAL SIGNS: Temp:  [97.5 F (36.4 C)-99.9 F (37.7 C)] 98.6 F (37 C) (05/21 1931) Pulse Rate:  [63-93] 78 (05/21 2000) Resp:  [23-38] 24 (05/21 2000) BP: (84-129)/(41-95) 97/59 (05/21 2000) SpO2:  [88 %-98 %] 97 % (05/21 2000) FiO2 (%):  [94 %] 94 % (05/21  1639) Weight:  [65.8 kg (145 lb)-71.2 kg (156 lb 15.5 oz)] 71.2 kg (156 lb 15.5 oz) (05/21 1850)  PHYSICAL EXAMINATION: General: elderly Caucasian male, NAD Neuro: alert occasionally disoriented to situation, follows commands, PERRLA HEENT: supple, no JVD Cardiovascular: nsr, s1s2, no M/R/G Lungs: mild rhonchi throughout, even, non labored  Abdomen: +BS x4, soft, non tender, non distended  Musculoskeletal: normal bulk and tone, no edema Skin: intact no rashes or lesions    Recent Labs Lab 01/26/17 1423  NA 140  K 5.2*  CL 105  CO2 21*  BUN 65*  CREATININE 2.38*  GLUCOSE 183*    Recent Labs Lab 01/26/17 1423  HGB 11.4*  HCT 35.4*  WBC 11.0*  PLT 126*   US Abdomen Complete  Result Date: 01/26/2017 CLINICAL DATA:  Acute onset of elevated LFTs.  Initial encounter. EXAM: ABDOMEN ULTRASOUND COMPLETE COMPARISON:  CT of the abdomen and pelvis from 08/09/2015 FINDINGS: Gallbladder: Diffuse gallbladder wall thickening is noted, measuring up to 6 mm, with mild echogenic sludge noted in the gallbladder. No definite stones are appreciated. No ultrasonographic Murphy's sign is elicited. No pericholecystic fluid is seen. Common bile duct: Diameter: 0.6 cm, within normal limits in caliber. Liver: No focal lesion identified. Scattered calcified granulomata are noted within the liver. Within normal limits in parenchymal echogenicity. IVC: No abnormality visualized. Pancreas: Not visualized. Spleen: Size and appearance within normal limits. Right Kidney: Length: 11.3 cm. Echogenicity within normal limits. No mass or hydronephrosis visualized. Left Kidney: Length: 11.0 cm.  Echogenicity within normal limits. No mass or hydronephrosis visualized. Abdominal aorta: No aneurysm visualized. Partially obscured by overlying bowel gas. Other findings: A small right pleural effusion is noted. IMPRESSION: 1. Diffuse gallbladder wall thickening, measuring up to 6 mm, with mild echogenic sludge. No  ultrasonographic Murphy's sign elicited. This may reflect chronic inflammation. 2. Small right pleural effusion. Electronically Signed   By: Garald Balding M.D.   On: 01/26/2017 18:05   Dg Chest Port 1 View  Result Date: 01/26/2017 CLINICAL DATA:  Altered mental status EXAM: PORTABLE CHEST 1 VIEW COMPARISON:  03/17/2016 FINDINGS: Cardiomegaly and vascular pedicle widening. Diffuse interstitial and airspace opacity. No effusion or pneumothorax. Dual-chamber pacer leads from the left in stable position. IMPRESSION: Diffuse symmetric lung opacity favoring CHF. Electronically Signed   By: Monte Fantasia M.D.   On: 01/26/2017 14:42    ASSESSMENT / PLAN: Acute hypoxic respiratory failure secondary to pulmonary edema and ?CAP Septic shock secondary to ?CAP  Acute renal failure likely secondary to hypotension Acute encephalopathy   Elevated liver enzymes of unknown etiology  Hx: Episodic Atrial Fibrillation, Hyperlipidemia, Parkinson Disease, Myocardial Infarction, and Diabetes Mellitus P: Supplemental O2 to maintain O2 sats >92% Prn bronchodilator therapy  Continuous telemetry monitoring  Echo pending Continue empiric abx for now  Follow cultures  Decrease NS @ 50 mlhr Maintain map goal >65 Trend WBC and monitor fever curve Trend PCT's Trend CMP's Hold outpatient amiodarone, pravastatin, pregabalin, pramipexole, and doxepin  Trend BMP's Monitor UOP  Replace electrolytes as indicated  Hepatitis panel pending GI consulted appreciate input  CBG's ac/hs  [facility]  Marda Stalker, [place] Pager [phone] (please enter 7 digits) PCCM Consult Pager [phone] (please enter 7 digits)    Merton Border, MD PCCM service Mobile [phone] Pager [phone] 01/27/2017 12:04 PM

## 2017-01-26 NOTE — ED Provider Notes (Signed)
[facility] Emergency Department Provider Note   ____________________________________________    I have reviewed the triage vital signs and the nursing notes.   HISTORY  Chief Complaint Shortness of Breath and Altered Mental Status   History of present illness Limited by altered mental status  HPI Johnny Bowen is a 83 y.o. male who presents with altered mental status and shortness of breath. Patient is unable to provide significant history. EMS reports the patient was hypoxic upon their arrival, they put him on nasal cannula gave DuoNeb and Solu-Medrol. They reported temperature of 100.0.  Past Medical History:  Diagnosis Date  . Allergic rhinitis   . Anemia   . Arthritis   . CAD (coronary artery disease)   . Diabetes mellitus with neurological manifestations (HCC)   . Episodic atrial fibrillation (HCC)   . GERD (gastroesophageal reflux disease)   . Heart block   . History of lower GI bleeding   . HLD (hyperlipidemia)   . HTN (hypertension)   . MI (myocardial infarction) (HCC)   . Proteinuria   . RLS (restless legs syndrome)   . Shingles     Patient Active Problem List   Diagnosis Date Noted  . Pain in limb 01/16/2017  . Microscopic hematuria 12/10/2015  . Recurrent UTI 12/10/2015  . Right ureteral stone 12/10/2015  . HB (heart block) 07/26/2015  . Myocardial infarction (HCC) 07/26/2015  . Parkinson's disease (HCC) 04/07/2014  . Absolute anemia 03/06/2014  . Paroxysmal atrial fibrillation (HCC) 03/06/2014  . Abnormal presence of protein in urine 03/06/2014  . Allergic rhinitis 03/04/2014  . Arteriosclerosis of coronary artery 03/04/2014  . Diabetes mellitus (HCC) 03/04/2014  . Benign essential HTN 03/04/2014  . Acid reflux 03/04/2014  . HLD (hyperlipidemia) 03/04/2014  . Restless leg 03/04/2014  . Sensory neuropathy 03/04/2014    Past Surgical History:  Procedure Laterality Date  . CATARACT EXTRACTION    . COLONOSCOPY     . ESOPHAGOGASTRODUODENOSCOPY    . HERNIA REPAIR    . PACEMAKER PLACEMENT      Prior to Admission medications   Medication Sig Start Date End Date Taking? Authorizing Provider  amiodarone (PACERONE) 200 MG tablet Take by mouth. 08/08/15   [provider]  apixaban (ELIQUIS) 5 MG TABS tablet Take by mouth. 09/11/14   [provider]  carbidopa-levodopa (SINEMET IR) 25-100 MG tablet Take by mouth. 05/22/15 03/17/16  [provider]  doxepin (SINEQUAN) 75 MG capsule  08/09/15   [provider]  fluticasone (FLONASE) 50 MCG/ACT nasal spray Place into the nose. 03/07/15   [provider]  latanoprost (XALATAN) 0.005 % ophthalmic solution Apply to eye. 01/07/15   [provider]  LYRICA 150 MG capsule  01/09/17   [provider]  metFORMIN (GLUCOPHAGE) 500 MG tablet  07/26/15   [provider]  nitrofurantoin, macrocrystal-monohydrate, (MACROBID) 100 MG capsule Reported on 12/10/2015 11/27/15   [provider]  nitroGLYCERIN (NITROSTAT) 0.4 MG SL tablet Place under the tongue.    [provider]  Omeprazole 20 MG TBEC Take by mouth.    [provider]  oxyCODONE (OXY IR/ROXICODONE) 5 MG immediate release tablet 1 po tid prn Earliest Fill Date: 07/25/15 07/25/15   [provider]  pramipexole (MIRAPEX) 1 MG tablet Take by mouth. 05/22/15 08/20/15  [provider]  pramipexole (MIRAPEX) 1 MG tablet  10/31/15   [provider]  pravastatin (PRAVACHOL) 40 MG tablet Take by mouth. 03/07/15   [provider]  pregabalin (LYRICA) 100 MG capsule 1 po qHS 02/21/15   [provider]     Allergies Atorvastatin and Esomeprazole magnesium  Family History  Problem Relation Age of Onset  . Kidney disease Neg Hx   . Prostate cancer Neg Hx     Social History Social History  Substance Use Topics  . Smoking status: Former Smoker    Quit date: 07/31/1995  . Smokeless tobacco:  Never Used  . Alcohol use No    Level V caveat: Unable to obtain Review of Systems as the patient has significant altered mental status    ____________________________________________   PHYSICAL EXAM:  VITAL SIGNS: ED Triage Vitals  Enc Vitals Group     BP 01/26/17 1430 (!) 113/95     Pulse Rate 01/26/17 1430 93     Resp 01/26/17 1430 (!) 24     Temp 01/26/17 1440 99.9 F (37.7 C)     Temp Source 01/26/17 1440 Oral     SpO2 01/26/17 1430 93 %     Weight 01/26/17 1440 65.8 kg (145 lb)     Height 01/26/17 1440 1.702 m (5\' 7" )     Head Circumference --      Peak Flow --      Pain Score --      Pain Loc --      Pain Edu? --      Excl. in GC? --     Constitutional: Alert , Ill-appearing Eyes: Conjunctivae are normal.  Head: Atraumatic. Nose: No congestion/rhinnorhea. Mouth/Throat: Mucous membranes are dry Neck:  Painless ROM Cardiovascular: Normal rate, regular rhythm. Grossly normal heart sounds.  Good peripheral circulation. Respiratory: Increased respiratory effort without tachypnea, bibasilar rales, scattered mild wheezes Gastrointestinal: Soft and nontender. No distention.  No CVA tenderness. Genitourinary: deferred Musculoskeletal: No lower extremity tenderness nor edema.  Warm and well perfused Neurologic:  . No gross focal neurologic deficits are appreciated.  Skin:  Skin is warm, dry and intact. No rash noted. Psychiatric: Unable to examine  ____________________________________________   LABS (all labs ordered are listed, but only abnormal results are displayed)  Labs Reviewed  LACTIC ACID, PLASMA - Abnormal; Notable for the following:       Result Value   Lactic Acid, Venous 4.1 (*)    All other components within normal limits  COMPREHENSIVE METABOLIC PANEL - Abnormal; Notable for the following:    Potassium 5.2 (*)    CO2 21 (*)    Glucose, Bld 183 (*)    BUN 65 (*)    Creatinine, Ser 2.38 (*)    Calcium 8.5 (*)    AST 3,169 (*)    ALT 1,171 (*)     Total Bilirubin 3.0 (*)    GFR calc non Af Amer 23 (*)    GFR calc Af Amer 27 (*)    All other components within normal limits  CBC WITH DIFFERENTIAL/PLATELET - Abnormal; Notable for the following:    WBC 11.0 (*)    RBC 4.35 (*)    Hemoglobin 11.4 (*)    HCT 35.4 (*)    RDW 16.1 (*)    Platelets 126 (*)    Neutro Abs 10.0 (*)    Lymphs Abs 0.4 (*)    All other components within normal limits  URINALYSIS, ROUTINE W REFLEX MICROSCOPIC - Abnormal; Notable for the following:    Color, Urine AMBER (*)    APPearance HAZY (*)    Hgb urine dipstick MODERATE (*)  Protein, ur 100 (*)    Bacteria, UA RARE (*)    Squamous Epithelial / LPF 0-5 (*)    All other components within normal limits  BLOOD GAS, VENOUS - Abnormal; Notable for the following:    pCO2, Ven 36 (*)    pO2, Ven 56.0 (*)    Acid-base deficit 3.3 (*)    All other components within normal limits  CULTURE, BLOOD (ROUTINE X 2)  CULTURE, BLOOD (ROUTINE X 2)  URINE CULTURE  LACTIC ACID, PLASMA   ____________________________________________  EKG  ED ECG REPORT I, Jene Every, the attending physician, personally viewed and interpreted this ECG.  Date: 01/26/2017  Rhythm: V paced  Intervals: normal ST/T Wave abnormalities: Nonspecific Conduction Disturbances: Nonspecific   ____________________________________________  RADIOLOGY  Bibasilar opacities on chest x-ray ____________________________________________   PROCEDURES  Procedure(s) performed: No    Critical Care performed: yes  CRITICAL CARE Performed by: Jene Every   Total critical care time: 30 minutes  Critical care time was exclusive of separately billable procedures and treating other patients.  Critical care was necessary to treat or prevent imminent or life-threatening deterioration.  Critical care was time spent personally by me on the following activities: development of treatment plan with patient and/or surrogate as well  as nursing, discussions with consultants, evaluation of patient's response to treatment, examination of patient, obtaining history from patient or surrogate, ordering and performing treatments and interventions, ordering and review of laboratory studies, ordering and review of radiographic studies, pulse oximetry and re-evaluation of patient's condition.  ____________________________________________   INITIAL IMPRESSION / ASSESSMENT AND PLAN / ED COURSE  Pertinent labs & imaging results that were available during my care of the patient were reviewed by me and considered in my medical decision making (see chart for details).  Patient presented with altered mental status and shortness of breath, temperature is elevated as well. Code sepsis called. Chest x-ray suspicious for pneumonia, broad-spectrum IV antibiotics initiated   Lactic acid is greater than 4, IV fluids initiated, 30 mL Per kilogram  Sepsis - Repeat Assessment  Performed at:    3:45pm  Vitals     Blood pressure 129/74, pulse 88, temperature 99.9 F (37.7 C), temperature source Oral, resp. rate (!) 31, height 1.702 m (5\' 7" ), weight 65.8 kg (145 lb), SpO2 95 %.  Heart:     Regular rate and rhythm  Lungs:    Rales  Capillary Refill:   <2 sec  Peripheral Pulse:   Radial pulse palpable  Skin:     Normal Color    Will admit to the hospitalist for further management and care   ____________________________________________   FINAL CLINICAL IMPRESSION(S) / ED DIAGNOSES  Final diagnoses:  Altered mental status, unspecified altered mental status type  Sepsis, due to unspecified organism [facility])      NEW MEDICATIONS STARTED DURING THIS VISIT:  New Prescriptions   No medications on file     Note:  This document was prepared using Dragon voice recognition software and may include unintentional dictation errors.    Jene Every, MD 01/26/17 [phone]

## 2017-01-26 NOTE — ED Notes (Signed)
Code  Sepsis  Called  To  [facility] 

## 2017-01-26 NOTE — ED Triage Notes (Signed)
Patient presents to the ED via EMS from home.  Patient's oxygen level was in the 70s when the fire department arrived at patient's home.  EMS gave patient 1 duoneb and 125mg  of solumedrol and placed patient on 8L [place].  Per patient's family, around 4/5pm yesterday patient stopped talking which is abnormal for him.  Patient has early stages of dementia but usually can speak clearly.  Patient appears weak, tired and pale.  Grip strength is weak but equal on both sides.  Patient's breathing is very rapid at this time.

## 2017-01-26 NOTE — Progress Notes (Signed)
ANTICOAGULATION CONSULT NOTE - Initial Consult  Pharmacy Consult for Eliquis  Indication: atrial fibrillation  Allergies  Allergen Reactions  . Atorvastatin     Other reaction(s): Other (See Comments) Restless leg syndrome  . Esomeprazole Magnesium     Other reaction(s): Other (See Comments) Stomach irritation    Patient Measurements: Height: 5\' 10"  (177.8 cm) Weight: 156 lb 15.5 oz (71.2 kg) IBW/kg (Calculated) : 73 Heparin Dosing Weight:   Vital Signs: Temp: 97.5 F (36.4 C) (05/21 1850) Temp Source: Oral (05/21 1850) BP: 97/57 (05/21 1850) Pulse Rate: 85 (05/21 1850)  Labs:  Recent Labs  01/26/17 1423  HGB 11.4*  HCT 35.4*  PLT 126*  CREATININE 2.38*    Estimated Creatinine Clearance: 22.9 mL/min (A) (by C-G formula based on SCr of 2.38 mg/dL (H)).   Medical History: Past Medical History:  Diagnosis Date  . Allergic rhinitis   . Anemia   . Arthritis   . CAD (coronary artery disease)   . Diabetes mellitus with neurological manifestations (HCC)   . Episodic atrial fibrillation (HCC)   . GERD (gastroesophageal reflux disease)   . Heart block   . History of lower GI bleeding   . HLD (hyperlipidemia)   . HTN (hypertension)   . MI (myocardial infarction) (HCC)   . Parkinson disease (HCC)   . Proteinuria   . RLS (restless legs syndrome)   . Shingles     Medications:  Scheduled:  . apixaban  2.5 mg Oral BID  . fluticasone  1 spray Each Nare Daily  . insulin aspart  0-9 Units Subcutaneous TID WC  . latanoprost  1 drop Both Eyes QHS  . pantoprazole  40 mg Oral Daily    Assessment: 83 year old male, TBW = 71.2 kg ,  SrCr = 2.38 mg/dL   Goal of Therapy:  prevention of thromboembolism   Plan:  Eliquis 5 mg PO BID originally ordered.  Will adjust dose to eliquis 2.5 mg PO BID.   Aneliz Carbary D 01/26/2017,7:28 PM

## 2017-01-26 NOTE — H&P (Signed)
[facility] - [facility] at [facility]   PATIENT NAME: Celestino Ackerman    MR#:  [id number]  DATE OF BIRTH:  1929-09-29  DATE OF ADMISSION:  01/26/2017  PRIMARY CARE PHYSICIAN: Mickey Farber, MD   REQUESTING/REFERRING PHYSICIAN: Jene Every  MD  CHIEF COMPLAINT:   Chief Complaint  Patient presents with  . Shortness of Breath  . Altered Mental Status    HISTORY OF PRESENT ILLNESS: Davine Coba  is a 83 y.o. male with a known history of Allergic rhinitis, coronary artery disease, diabetes type 2, GERD, history of pacemaker, essential hypertension, Parkinson's disease who is brought in by his wife due to change in mental status. And shortness of breath. Patient for the past few months has not been eating or drinking much. Today he seemed to be breathing heavily. Therefore his wife decided to bring him to the hospital. In the emergency room is noted to have elevated lactic acid level possible pneumonia. As well as severely elevated liver function tests as well as acute renal failure. Patient is very weak and unable to contribute much to the history. His wife provides the history. She denies him having any cough or congestion. Patient denies any abdominal pain nausea vomiting or diarrhea.  PAST MEDICAL HISTORY:   Past Medical History:  Diagnosis Date  . Allergic rhinitis   . Anemia   . Arthritis   . CAD (coronary artery disease)   . Diabetes mellitus with neurological manifestations (HCC)   . Episodic atrial fibrillation (HCC)   . GERD (gastroesophageal reflux disease)   . Heart block   . History of lower GI bleeding   . HLD (hyperlipidemia)   . HTN (hypertension)   . MI (myocardial infarction) (HCC)   . Proteinuria   . RLS (restless legs syndrome)   . Shingles     PAST SURGICAL HISTORY: Past Surgical History:  Procedure Laterality Date  . CATARACT EXTRACTION    . COLONOSCOPY    . ESOPHAGOGASTRODUODENOSCOPY    . HERNIA REPAIR    . PACEMAKER PLACEMENT       SOCIAL HISTORY:  Social History  Substance Use Topics  . Smoking status: Former Smoker    Quit date: 07/31/1995  . Smokeless tobacco: Never Used  . Alcohol use No    FAMILY HISTORY:  Family History  Problem Relation Age of Onset  . Kidney disease Neg Hx   . Prostate cancer Neg Hx     DRUG ALLERGIES:  Allergies  Allergen Reactions  . Atorvastatin     Other reaction(s): Other (See Comments) Restless leg syndrome  . Esomeprazole Magnesium     Other reaction(s): Other (See Comments) Stomach irritation    REVIEW OF SYSTEMS:   CONSTITUTIONAL:Positive fever, Positive fatigue and weakness.  EYES: No blurred or double vision.  EARS, NOSE, AND THROAT: No tinnitus or ear pain.  RESPIRATORY: No cough, shortness of breath, wheezing or hemoptysis.  CARDIOVASCULAR: No chest pain, orthopnea, edema.  GASTROINTESTINAL: No nausea, vomiting, diarrhea or abdominal pain.  GENITOURINARY: No dysuria, hematuria.  ENDOCRINE: No polyuria, nocturia,  HEMATOLOGY: No anemia, easy bruising or bleeding SKIN: No rash or lesion. MUSCULOSKELETAL: No joint pain or arthritis.   NEUROLOGIC: No tingling, numbness, weakness.  PSYCHIATRY: No anxiety or depression.   MEDICATIONS AT HOME:  Prior to Admission medications   Medication Sig Start Date End Date Taking? Authorizing Provider  amiodarone (PACERONE) 200 MG tablet Take 100 mg by mouth 2 (two) times daily.  08/08/15  Yes [provider]  apixaban (ELIQUIS) 5 MG TABS tablet Take 5 mg by mouth 2 (two) times daily.  09/11/14  Yes [provider]  doxepin (SINEQUAN) 75 MG capsule 150 mg at bedtime as needed.  08/09/15  Yes [provider]  fluticasone (FLONASE) 50 MCG/ACT nasal spray Place 1 spray into both nostrils daily.  03/07/15  Yes [provider]  latanoprost (XALATAN) 0.005 % ophthalmic solution Place 1 drop into both eyes at bedtime.  01/07/15  Yes [provider]  metFORMIN (GLUCOPHAGE) 500 MG tablet  Take 500 mg by mouth 2 (two) times daily with a meal.  07/26/15  Yes [provider]  omeprazole (PRILOSEC) 20 MG capsule Take 20 mg by mouth daily.   Yes [provider]  pramipexole (MIRAPEX) 1 MG tablet Take 2 mg by mouth 2 (two) times daily.  10/31/15  Yes [provider]  pravastatin (PRAVACHOL) 40 MG tablet Take 40 mg by mouth daily.  03/07/15  Yes [provider]  pregabalin (LYRICA) 100 MG capsule 1 po qHS 02/21/15  Yes [provider]  carbidopa-levodopa (SINEMET IR) 25-100 MG tablet Take 1 tablet by mouth 3 (three) times daily.  05/22/15 03/17/16  [provider]  nitroGLYCERIN (NITROSTAT) 0.4 MG SL tablet Place 0.4 mg under the tongue every 5 (five) minutes as needed.     [provider]  oxyCODONE (OXY IR/ROXICODONE) 5 MG immediate release tablet 1 po tid prn Earliest Fill Date: 07/25/15 07/25/15   [provider]  pramipexole (MIRAPEX) 1 MG tablet Take 2 mg by mouth 2 (two) times daily.  05/22/15 08/20/15  [provider]      PHYSICAL EXAMINATION:   VITAL SIGNS: Blood pressure 129/74, pulse 88, temperature 99.9 F (37.7 C), temperature source Oral, resp. rate (!) 31, height 5\' 7"  (1.702 m), weight 145 lb (65.8 kg), SpO2 95 %.  GENERAL:  83 y.o.-year-old patient lying in the bed Chronically ill-appearing.  EYES: Pupils equal, round, reactive to light and accommodation. No scleral icterus. Extraocular muscles intact.  HEENT: Head atraumatic, normocephalic. Oropharynx and nasopharynx clear.  NECK:  Supple, no jugular venous distention. No thyroid enlargement, no tenderness.  LUNGS: Rhonchus breath sounds at the bases,  no wheezing, rales,rhonchi or crepitation. No use of accessory muscles of respiration.  CARDIOVASCULAR: S1, S2 normal. No murmurs, rubs, or gallops.  ABDOMEN: Soft, nontender, nondistended. Bowel sounds present. No organomegaly or mass.  EXTREMITIES: No pedal edema, cyanosis, or clubbing.   NEUROLOGIC: Cranial nerves II through XII are intact. Muscle strength 5/5 in all extremities. Sensation intact. Gait not checked.  PSYCHIATRIC: The patient is alert and oriented x 3.  SKIN: No obvious rash, lesion, or ulcer.   LABORATORY PANEL:   CBC  Recent Labs Lab 01/26/17 1423  WBC 11.0*  HGB 11.4*  HCT 35.4*  PLT 126*  MCV 81.4  MCH 26.2  MCHC 32.2  RDW 16.1*  LYMPHSABS 0.4*  MONOABS 0.6  EOSABS 0.0  BASOSABS 0.0   ------------------------------------------------------------------------------------------------------------------  Chemistries   Recent Labs Lab 01/26/17 1423  NA 140  K 5.2*  CL 105  CO2 21*  GLUCOSE 183*  BUN 65*  CREATININE 2.38*  CALCIUM 8.5*  AST 3,169*  ALT 1,171*  ALKPHOS 58  BILITOT 3.0*   ------------------------------------------------------------------------------------------------------------------ estimated creatinine clearance is 21.1 mL/min (A) (by C-G formula based on SCr of 2.38 mg/dL (H)). ------------------------------------------------------------------------------------------------------------------ No results for input(s): TSH, T4TOTAL, T3FREE, THYROIDAB in the last 72 hours.  Invalid input(s): FREET3   Coagulation profile No results  for input(s): INR, PROTIME in the last 168 hours. ------------------------------------------------------------------------------------------------------------------- No results for input(s): DDIMER in the last 72 hours. -------------------------------------------------------------------------------------------------------------------  Cardiac Enzymes No results for input(s): CKMB, TROPONINI, MYOGLOBIN in the last 168 hours.  Invalid input(s): CK ------------------------------------------------------------------------------------------------------------------ Invalid input(s):  POCBNP  ---------------------------------------------------------------------------------------------------------------  Urinalysis    Component Value Date/Time   COLORURINE AMBER (A) 01/26/2017 1505   APPEARANCEUR HAZY (A) 01/26/2017 1505   APPEARANCEUR Cloudy (A) 11/11/2016 1111   LABSPEC 1.021 01/26/2017 1505   PHURINE 5.0 01/26/2017 1505   GLUCOSEU NEGATIVE 01/26/2017 1505   HGBUR MODERATE (A) 01/26/2017 1505   BILIRUBINUR NEGATIVE 01/26/2017 1505   BILIRUBINUR Negative 11/11/2016 1111   KETONESUR NEGATIVE 01/26/2017 1505   PROTEINUR 100 (A) 01/26/2017 1505   NITRITE NEGATIVE 01/26/2017 1505   LEUKOCYTESUR NEGATIVE 01/26/2017 1505   LEUKOCYTESUR Negative 11/11/2016 1111     RADIOLOGY: Dg Chest Port 1 View  Result Date: 01/26/2017 CLINICAL DATA:  Altered mental status EXAM: PORTABLE CHEST 1 VIEW COMPARISON:  03/17/2016 FINDINGS: Cardiomegaly and vascular pedicle widening. Diffuse interstitial and airspace opacity. No effusion or pneumothorax. Dual-chamber pacer leads from the left in stable position. IMPRESSION: Diffuse symmetric lung opacity favoring CHF. Electronically Signed   By: Marnee Spring M.D.   On: 01/26/2017 14:42    EKG: Orders placed or performed in visit on 06/27/13  . EKG 12-Lead  . EKG 12-Lead    IMPRESSION AND PLAN: Patient is a 83 year old with multiple medical problems brought in due to altered mental status and hypoxia  1. Sepsis suspected due to possible pneumonia I will treat with IV ceftriaxone and azithromycin Follow-up blood cultures  2. Acute liver injury: Differential diagnoses include possible hypotension and shock liver Medication induced I will hold his amiodarone and his cholesterol medications GI has been consulted Obtain ultrasound of the abdomen  3. Acute renal failure provide IV fluids monitor renal function if no improvement will the nephrology consult  4. Atrial fibrillation continue Elavil course I will hold amiodarone due  to concern for liver toxicity  5. Hyperlipidemia unspecified will hold cholesterol medication  6. CODE STATUS discussed with the patient and wife they would like him to have be a DO NOT RESUSCITATE All the records are reviewed and case discussed with ED provider. Management plans discussed with the patient, family and they are in agreement.  CODE STATUS: Code Status History    This patient does not have a recorded code status. Please follow your organizational policy for patients in this situation.    Advance Directive Documentation     Most Recent Value  Type of Advance Directive  Healthcare Power of Attorney, Living will  Pre-existing out of facility DNR order (yellow form or pink MOST form)  -  "MOST" Form in Place?  -       TOTAL TIME TAKING CARE OF THIS PATIENT: 55 minutes.    Auburn Bilberry M.D on 01/26/2017 at 4:32 PM  Between 7am to 6pm - Pager - [phone]  After 6pm go to [email or web address] - password [email or web address] [facility]  [facility]  Office  [phone]  CC: Primary care physician; Mickey Farber, MD

## 2017-01-26 NOTE — ED Notes (Signed)
Dr. Allena Katz notified of patient's hypotension.  MD went to room to reassess patient and discuss plan of care.  New orders received at this time.

## 2017-01-26 NOTE — Progress Notes (Signed)
Advanced care plan.  Purpose of the Encounter: CODE STATUS  Parties in Attendance: Patient and his wife  Patient's Decision Capacity: Intact  Subjective/Patient's story: Patient is a 83 year old with Parkinson's disease as well as multiple medical problems presents with acute liver injury acute renal failure   Objective/Medical story:  Discussed with patient and his wife regarding CODE STATUS they would not want him resuscitated, no intubation    Goals of care determination: DNR    CODE STATUS: DNR   Time spent discussing advanced care planning: 15 minutes

## 2017-01-27 ENCOUNTER — Inpatient Hospital Stay: Payer: Medicare Other

## 2017-01-27 ENCOUNTER — Inpatient Hospital Stay
Admit: 2017-01-27 | Discharge: 2017-01-27 | Disposition: A | Discharge status: Home / Self Care | Payer: Medicare Other | Attending: Pulmonary Disease | Admitting: Pulmonary Disease

## 2017-01-27 DIAGNOSIS — R918 Other nonspecific abnormal finding of lung field: Secondary | ICD-10-CM

## 2017-01-27 DIAGNOSIS — R748 Abnormal levels of other serum enzymes: Secondary | ICD-10-CM

## 2017-01-27 DIAGNOSIS — J9601 Acute respiratory failure with hypoxia: Secondary | ICD-10-CM

## 2017-01-27 DIAGNOSIS — R401 Stupor: Secondary | ICD-10-CM

## 2017-01-27 DIAGNOSIS — I219 Acute myocardial infarction, unspecified: Secondary | ICD-10-CM

## 2017-01-27 LAB — COMPREHENSIVE METABOLIC PANEL
ALK PHOS: 56 U/L (ref 38–126)
ALT: 1191 U/L — AB (ref 17–63)
AST: 1392 U/L — ABNORMAL HIGH (ref 15–41)
Albumin: 3.1 g/dL — ABNORMAL LOW (ref 3.5–5.0)
Anion gap: 8 (ref 5–15)
BILIRUBIN TOTAL: 2.3 mg/dL — AB (ref 0.3–1.2)
BUN: 62 mg/dL — ABNORMAL HIGH (ref 6–20)
CALCIUM: 8.4 mg/dL — AB (ref 8.9–10.3)
CO2: 25 mmol/L (ref 22–32)
CREATININE: 1.67 mg/dL — AB (ref 0.61–1.24)
Chloride: 111 mmol/L (ref 101–111)
GFR, EST AFRICAN AMERICAN: 41 mL/min — AB (ref >60)
GFR, EST NON AFRICAN AMERICAN: 36 mL/min — AB (ref >60)
Glucose, Bld: 152 mg/dL — ABNORMAL HIGH (ref 65–99)
Potassium: 4 mmol/L (ref 3.5–5.1)
Sodium: 144 mmol/L (ref 135–145)
Total Protein: 5.9 g/dL — ABNORMAL LOW (ref 6.5–8.1)

## 2017-01-27 LAB — ECHOCARDIOGRAM COMPLETE
HEIGHTINCHES: 70 in
WEIGHTICAEL: 2511.48 [oz_av]

## 2017-01-27 LAB — URINE CULTURE: Culture: NO GROWTH

## 2017-01-27 LAB — HEPATITIS PANEL, ACUTE
HCV Ab: <0.1 s/co ratio (ref 0.0–0.9)
HEP A IGM: NEGATIVE
Hep B C IgM: NEGATIVE
Hepatitis B Surface Ag: NEGATIVE

## 2017-01-27 LAB — GLUCOSE, CAPILLARY
GLUCOSE-CAPILLARY: 138 mg/dL — AB (ref 65–99)
Glucose-Capillary: 125 mg/dL — ABNORMAL HIGH (ref 65–99)
Glucose-Capillary: 146 mg/dL — ABNORMAL HIGH (ref 65–99)
Glucose-Capillary: 95 mg/dL (ref 65–99)

## 2017-01-27 LAB — CBC
HEMATOCRIT: 31.8 % — AB (ref 40.0–52.0)
HEMOGLOBIN: 10.4 g/dL — AB (ref 13.0–18.0)
MCH: 27.1 pg (ref 26.0–34.0)
MCHC: 32.6 g/dL (ref 32.0–36.0)
MCV: 83.3 fL (ref 80.0–100.0)
Platelets: 69 10*3/uL — ABNORMAL LOW (ref 150–440)
RBC: 3.82 MIL/uL — AB (ref 4.40–5.90)
RDW: 16.2 % — ABNORMAL HIGH (ref 11.5–14.5)
WBC: 8.4 10*3/uL (ref 3.8–10.6)

## 2017-01-27 LAB — TROPONIN I: Troponin I: 1.75 ng/mL (ref <0.03)

## 2017-01-27 LAB — BRAIN NATRIURETIC PEPTIDE: B Natriuretic Peptide: 1624 pg/mL — ABNORMAL HIGH (ref 0.0–100.0)

## 2017-01-27 MED ORDER — SODIUM CHLORIDE 0.9 % IV SOLN
3.0000 g | Freq: Four times a day (QID) | INTRAVENOUS | Status: DC
  Administered 2017-01-27 – 2017-01-30 (×12): 3 g via INTRAVENOUS
  Filled 2017-01-27 (×18): qty 3

## 2017-01-27 MED ORDER — FUROSEMIDE 10 MG/ML IJ SOLN
40.0000 mg | Freq: Once | INTRAMUSCULAR | Status: AC
  Administered 2017-01-27: 15:00:00 40 mg via INTRAVENOUS
  Filled 2017-01-27: qty 4

## 2017-01-27 MED ORDER — PERFLUTREN LIPID MICROSPHERE
1.0000 mL | INTRAVENOUS | Status: AC | PRN
  Administered 2017-01-27: 2 mL via INTRAVENOUS
  Filled 2017-01-27: qty 10

## 2017-01-27 MED ORDER — DOXYCYCLINE HYCLATE 100 MG PO TABS
100.0000 mg | ORAL_TABLET | Freq: Two times a day (BID) | ORAL | Status: DC
  Administered 2017-01-27 – 2017-01-30 (×7): 100 mg via ORAL
  Filled 2017-01-27 (×7): qty 1

## 2017-01-27 NOTE — Progress Notes (Addendum)
PCCM PROGRESS NOTE  RASS 0. Cognition intact. Denies pain and dyspnea. NAD  Vitals:   01/27/17 0500 01/27/17 0600 01/27/17 0700 01/27/17 0800  BP: 114/68 107/70 107/70 111/69  Pulse: 75 73 74 76  Resp: 20 (!) 21 (!) 23 18  Temp: 98.2 F (36.8 C)   97.1 F (36.2 C)  TempSrc: Oral   Axillary  SpO2: 94% 96% 98% 92%  Weight:      Height:      4 LPM [place]  RASS 0, cognition intact NAD HEENT WNL No JVD Chest clear anteriorly, few basilar crackles posteriorly, no wheezes RRR with soft, blowing systolic M radiating to apex Abd soft, NT, +BS Ext warm, no edema  BMP Latest Ref Rng & Units 01/27/2017 01/26/2017 06/28/2013  Glucose 65 - 99 mg/dL 060(R) 561(B) 379(K)  BUN 6 - 20 mg/dL 32(X) 61(Y) 17  Creatinine 0.61 - 1.24 mg/dL 7.09(K) 9.57(M) 7.34  Sodium 135 - 145 mmol/L 144 140 139  Potassium 3.5 - 5.1 mmol/L 4.0 5.2(H) 4.0  Chloride 101 - 111 mmol/L 111 105 106  CO2 22 - 32 mmol/L 25 21(L) 28  Calcium 8.9 - 10.3 mg/dL 0.3(J) 0.9(U) 8.8   CBC Latest Ref Rng & Units 01/27/2017 01/26/2017 06/28/2013  WBC 3.8 - 10.6 K/uL 8.4 11.0(H) 6.8  Hemoglobin 13.0 - 18.0 g/dL 10.4(L) 11.4(L) 13.1  Hematocrit 40.0 - 52.0 % 31.8(L) 35.4(L) 37.6(L)  Platelets 150 - 440 K/uL 69(L) 126(L) 131(L)   LFTs improving  CXR: R>L AS dz - edema vs ALI pattern EKG: nonspecific changes, no definite ischemia RUQ [place]: Diffuse gallbladder wall thickening  IMPRESSION: Acute hypoxic respiratory failure Suspected pulmonary edema Doubt PNA Murmur c/w MR noted - not previously documented AKI, Cr improving Elevated LFTs - improving Diffuse gallbladder wall thickening - possible cholecystitis AMS, resolved  PLAN/REC: OK to transfer to M-S floor with cardiac monitoring  Check trop I and F/U echocardiogram Lasix X 1 dose Follow CXR Monitor BMET intermittently Monitor I/Os Correct electrolytes as indicated Monitor LFTs Change abx to amp-sulbactam for possible cholecystitis  If CXR improves with diuresis,  PCCM will sign off. Please call if we can be of further assistance    Billy Fischer, MD PCCM service Mobile [phone] Pager [phone] 01/27/2017 12:59 PM    ADD: Troponin I 1.75. Cardiology consultation requested  Billy Fischer, MD PCCM service Mobile [phone] Pager [phone] 01/27/2017 1:01 PM

## 2017-01-27 NOTE — Progress Notes (Addendum)
[facility] - [place] at [facility]   PATIENT NAME: Johnny Bowen    MR#:  [id number]  DATE OF BIRTH:  1929-10-22  SUBJECTIVE:  Patient was brought in from home by wife with poor by mouth intake for the last few months more so for a few days and increasing shortness of breath. He was found to have hypotension and tachycardia and chest are consistent with interstitial bilateral pneumonia The patient is awake alert, wants to go home. Sister in the room REVIEW OF SYSTEMS:   Review of Systems  Constitutional: Negative for chills, fever and weight loss.  HENT: Negative for ear discharge, ear pain and nosebleeds.   Eyes: Negative for blurred vision, pain and discharge.  Respiratory: Positive for shortness of breath. Negative for sputum production, wheezing and stridor.   Cardiovascular: Negative for chest pain, palpitations, orthopnea and PND.  Gastrointestinal: Negative for abdominal pain, diarrhea, nausea and vomiting.  Genitourinary: Negative for frequency and urgency.  Musculoskeletal: Negative for back pain and joint pain.  Neurological: Positive for weakness. Negative for sensory change, speech change and focal weakness.  Psychiatric/Behavioral: Negative for depression and hallucinations. The patient is not nervous/anxious.    Tolerating Diet:yes Tolerating PT: pending  DRUG ALLERGIES:   Allergies  Allergen Reactions  . Atorvastatin     Other reaction(s): Other (See Comments) Restless leg syndrome  . Esomeprazole Magnesium     Other reaction(s): Other (See Comments) Stomach irritation    VITALS:  Blood pressure 105/61, pulse 80, temperature 97.7 F (36.5 C), temperature source Oral, resp. rate 18, height 5\' 10"  (1.778 m), weight 71.2 kg (156 lb 15.5 oz), SpO2 93 %.  PHYSICAL EXAMINATION:   Physical Exam  GENERAL:  83 y.o.-year-old patient lying in the bed with no acute distress. thin EYES: Pupils equal, round, reactive to light and  accommodation. No scleral icterus. Extraocular muscles intact.  HEENT: Head atraumatic, normocephalic. Oropharynx and nasopharynx clear.  NECK:  Supple, no jugular venous distention. No thyroid enlargement, no tenderness.  LUNGS: Normal breath sounds bilaterally, no wheezing, rales, rhonchi. No use of accessory muscles of respiration.  CARDIOVASCULAR: S1, S2 normal. No murmurs, rubs, or gallops.  ABDOMEN: Soft, nontender, nondistended. Bowel sounds present. No organomegaly or mass.  EXTREMITIES: No cyanosis, clubbing or edema b/l.    NEUROLOGIC: Cranial nerves II through XII are intact. No focal Motor or sensory deficits b/l.  weak PSYCHIATRIC:  patient is alert and oriented x 2 SKIN: No obvious rash, lesion, or ulcer.   LABORATORY PANEL:  CBC  Recent Labs Lab 01/27/17 0515  WBC 8.4  HGB 10.4*  HCT 31.8*  PLT 69*    Chemistries   Recent Labs Lab 01/27/17 0515  NA 144  K 4.0  CL 111  CO2 25  GLUCOSE 152*  BUN 62*  CREATININE 1.67*  CALCIUM 8.4*  AST 1,392*  ALT 1,191*  ALKPHOS 56  BILITOT 2.3*   Cardiac Enzymes  Recent Labs Lab 01/27/17 1038  TROPONINI 1.75*   RADIOLOGY:  US Abdomen Complete  Result Date: 01/26/2017 CLINICAL DATA:  Acute onset of elevated LFTs.  Initial encounter. EXAM: ABDOMEN ULTRASOUND COMPLETE COMPARISON:  CT of the abdomen and pelvis from 08/09/2015 FINDINGS: Gallbladder: Diffuse gallbladder wall thickening is noted, measuring up to 6 mm, with mild echogenic sludge noted in the gallbladder. No definite stones are appreciated. No ultrasonographic Murphy's sign is elicited. No pericholecystic fluid is seen. Common bile duct: Diameter: 0.6 cm, within normal limits in caliber. Liver: No focal lesion  identified. Scattered calcified granulomata are noted within the liver. Within normal limits in parenchymal echogenicity. IVC: No abnormality visualized. Pancreas: Not visualized. Spleen: Size and appearance within normal limits. Right Kidney: Length:  11.3 cm. Echogenicity within normal limits. No mass or hydronephrosis visualized. Left Kidney: Length: 11.0 cm. Echogenicity within normal limits. No mass or hydronephrosis visualized. Abdominal aorta: No aneurysm visualized. Partially obscured by overlying bowel gas. Other findings: A small right pleural effusion is noted. IMPRESSION: 1. Diffuse gallbladder wall thickening, measuring up to 6 mm, with mild echogenic sludge. No ultrasonographic Murphy's sign elicited. This may reflect chronic inflammation. 2. Small right pleural effusion. Electronically Signed   By: Roanna Raider M.D.   On: 01/26/2017 18:05   Dg Chest Port 1 View  Result Date: 01/27/2017 CLINICAL DATA:  Respiratory failure. EXAM: PORTABLE CHEST 1 VIEW COMPARISON:  01/26/2017. FINDINGS: Cardiac pacer in stable position. Cardiomegaly with diffuse bilateral pulmonary infiltrates/edema. No interim change. No pleural effusion or pneumothorax . IMPRESSION: 1.  Stable cardiomegaly.  Cardiac pacer in stable position. 2. Diffuse bilateral pulmonary infiltrates/ edema. No interim change. Electronically Signed   By: Maisie Fus  Register   On: 01/27/2017 06:34   Dg Chest Port 1 View  Result Date: 01/26/2017 CLINICAL DATA:  Altered mental status EXAM: PORTABLE CHEST 1 VIEW COMPARISON:  03/17/2016 FINDINGS: Cardiomegaly and vascular pedicle widening. Diffuse interstitial and airspace opacity. No effusion or pneumothorax. Dual-chamber pacer leads from the left in stable position. IMPRESSION: Diffuse symmetric lung opacity favoring CHF. Electronically Signed   By: Marnee Spring M.D.   On: 01/26/2017 14:42   ASSESSMENT AND PLAN:   [name]  is a 83 y.o. male with a known history of Allergic rhinitis, coronary artery disease, diabetes type 2, GERD, history of pacemaker, essential hypertension, Parkinson's disease who is brought in by his wife due to change in mental status. And shortness of breath. Patient for the past few months has not been eating  or drinking much. He was found to have elevated lactic acid level possible pneumonia  1. Sever Sepsis suspected due to possible pneumonia -was on IV ceftriaxone and azithromycin---IV unasyn (cover GB dz) and po zithromax -blood cultures negative so far -afebrile -wbc normal  2. Acute liver injury appears from severe sepsis with hypotension Differential diagnoses include possible hypotension, shock liver, Chronic choleastasis will hold his amiodarone, elquis and his cholesterol medications GI has been consulted ultrasound of the abdomen chronic GB sludge  3. Acute renal failure  -provide IV fluids monitor renal function if no improvement will the nephrology consult  4. Atrial fibrillation  -holding amiodarone and eliquis -HR in the 70's  5. Hyperlipidemia unspecified will hold cholesterol medication  6. TCP plt count 126---69 K (?sepsis) SCD for now  7.Elevated troponin suspect demand ischemia from sepsis and hypotension -pt has no CP -cardiology consulted- Dr Juliann Pares  Case discussed with Care Management/Social Worker. Management plans discussed with the patient, family and they are in agreement.  CODE STATUS: DNR  DVT Prophylaxis: SCD due to drop in plts  TOTAL TIME TAKING CARE OF THIS PATIENT: *30* minutes.  >50% time spent on counselling and coordination of care pt and sister  POSSIBLE D/C IN 2-3 DAYS, DEPENDING ON CLINICAL CONDITION.  Note: This dictation was prepared with Dragon dictation along with smaller phrase technology. Any transcriptional errors that result from this process are unintentional.  Hugo Lybrand M.D on 01/27/2017 at 2:34 PM  Between 7am to 6pm - Pager - [phone]  After 6pm go to [email or web address] -  password Environmental education officer  [facility]  Office  [phone]  CC: Primary care physician; Mickey Farber, MD

## 2017-01-27 NOTE — Progress Notes (Signed)
Initial Nutrition Assessment  DOCUMENTATION CODES:   Severe malnutrition in context of chronic illness  INTERVENTION:  1. Glucerna Shake po TID, each supplement provides 220 kcal and 10 grams of protein  NUTRITION DIAGNOSIS:   Malnutrition related to chronic illness as evidenced by percent weight loss.  GOAL:   Patient will meet greater than or equal to 90% of their needs  MONITOR:   PO intake, I & O's, Labs, Weight trends, Supplement acceptance  REASON FOR ASSESSMENT:   Malnutrition Screening Tool    ASSESSMENT:   Johnny Bowen  is a 83 y.o. male with a known history of Allergic rhinitis, coronary artery disease, diabetes type 2, GERD, history of pacemaker, essential hypertension, Parkinson's disease who is brought in by his wife due to change in mental status  Spoke with Johnny Bowen wife at bedside. She states patient has not eaten or drank anything for 3 days until this morning when he consumed a muffin.  Prior to that, he would normally drink "a protein drink" for breakfast, does not eat lunch, and eats a variety of things for dinner. Wife cited beef stew. Patient does have active swallowing problems, but wife does not report a need for modified diets or thickened liquids at this point in time. He has even consumed a steak recently. She has been giving him "whatever he wants to eat," but patient exhibits poor appetite. Denies any nausea/vomiting or diarrhea/constipation.  She reports him being ~203# 3 years ago, with consistent, progressive weight loss. Mostly recently, he exhibits a 15#/8.8% severe wt loss over the past 2 months.  Nutrition-Focused physical exam completed. Findings are severe fat depletion, severe muscle depletion, and no edema.   Labs and medications reviewed: CBGs 125-146, Elevated LFT's TBili 2.3 Cr trending downwards  Diet Order:  Diet heart healthy/carb modified Room service appropriate? Yes; Fluid consistency: Thin  Skin:  Reviewed, no  issues  Last BM:  PTA  Height:   Ht Readings from Last 1 Encounters:  01/26/17 5\' 10"  (1.778 m)    Weight:   Wt Readings from Last 1 Encounters:  01/26/17 156 lb 15.5 oz (71.2 kg)    Ideal Body Weight:  75.45 kg  BMI:  Body mass index is 22.52 kg/m.  Estimated Nutritional Needs:   Kcal:  1600-2000 calories  Protein:  71-85 gm  Fluid:  >/= 1.6L  EDUCATION NEEDS:   Education needs no appropriate at this time  Dionne Ano. Jaeleigh Monaco, MS, RD LDN Inpatient Clinical Dietitian Pager [phone]

## 2017-01-27 NOTE — Consult Note (Signed)
[name], MD [facility]  [address], [address] [place], [place] [address] Phone: [phone] Fax : [phone]  Consultation  Referring Provider:     Dr. Allena Katz Primary Care Physician:  Mickey Farber, MD Primary Gastroenterologist:  Dr. Mechele Collin         Reason for Consultation:     Elevated liver enzymes  Date of Admission:  01/26/2017 Date of Consultation:  01/27/2017         HPI:   Johnny Bowen is a 83 y.o. male who was admitted with altered mental status. The patient was also noted by his wife to be breathing heavily therefore she decided to bring him to the hospital.  The patient was found to have elevated liver enzymes.  On admission the AST was 3169 with the ALTof 1171.  This morning the repeat labs showed the AST to come down to 1392 and the ALT to be 1191.  The patient's alkaline phosphatase was normal and the bilirubin was 3 and came down to 2.3.  The patient denies any abdominal pain, nausea, vomiting, Fevers or chills.  The patient had an elevated lactic acid and a troponin of 1.75. There is no report of any history of liver problems.  The patient's sister states that the patient is on lots of medication including pain medication. His BNP was also elevated at 1624.  Past Medical History:  Diagnosis Date  . Allergic rhinitis   . Anemia   . Arthritis   . CAD (coronary artery disease)   . Diabetes mellitus with neurological manifestations (HCC)   . Episodic atrial fibrillation (HCC)   . GERD (gastroesophageal reflux disease)   . Heart block   . History of lower GI bleeding   . HLD (hyperlipidemia)   . HTN (hypertension)   . MI (myocardial infarction) (HCC)   . Parkinson disease (HCC)   . Proteinuria   . RLS (restless legs syndrome)   . Shingles     Past Surgical History:  Procedure Laterality Date  . CATARACT EXTRACTION    . COLONOSCOPY    . ESOPHAGOGASTRODUODENOSCOPY    . HERNIA REPAIR    . PACEMAKER PLACEMENT      Prior to Admission medications     Medication Sig Start Date End Date Taking? Authorizing Provider  amiodarone (PACERONE) 200 MG tablet Take 100 mg by mouth 2 (two) times daily.  08/08/15  Yes [provider]  apixaban (ELIQUIS) 5 MG TABS tablet Take 5 mg by mouth 2 (two) times daily.  09/11/14  Yes [provider]  doxepin (SINEQUAN) 75 MG capsule 150 mg at bedtime as needed.  08/09/15  Yes [provider]  fluticasone (FLONASE) 50 MCG/ACT nasal spray Place 1 spray into both nostrils daily.  03/07/15  Yes [provider]  latanoprost (XALATAN) 0.005 % ophthalmic solution Place 1 drop into both eyes at bedtime.  01/07/15  Yes [provider]  metFORMIN (GLUCOPHAGE) 500 MG tablet Take 500 mg by mouth 2 (two) times daily with a meal.  07/26/15  Yes [provider]  omeprazole (PRILOSEC) 20 MG capsule Take 20 mg by mouth daily.   Yes [provider]  pramipexole (MIRAPEX) 1 MG tablet Take 2 mg by mouth 2 (two) times daily.  10/31/15  Yes [provider]  pravastatin (PRAVACHOL) 40 MG tablet Take 40 mg by mouth daily.  03/07/15  Yes [provider]  pregabalin (LYRICA) 100 MG capsule 1 po qHS 02/21/15  Yes [provider]  carbidopa-levodopa (SINEMET IR) 25-100 MG tablet Take 1 tablet by mouth 3 (three) times daily.  05/22/15 03/17/16  [provider]  nitroGLYCERIN (NITROSTAT) 0.4 MG SL tablet Place 0.4 mg under the tongue every 5 (five) minutes as needed.     [provider]  oxyCODONE (OXY IR/ROXICODONE) 5 MG immediate release tablet 1 po tid prn Earliest Fill Date: 07/25/15 07/25/15   [provider]  pramipexole (MIRAPEX) 1 MG tablet Take 2 mg by mouth 2 (two) times daily.  05/22/15 08/20/15  [provider]    Family History  Problem Relation Age of Onset  . Kidney disease Neg Hx   . Prostate cancer Neg Hx      Social History  Substance Use Topics  . Smoking status: Former Smoker    Quit date: 07/31/1995  .  Smokeless tobacco: Never Used  . Alcohol use No    Allergies as of 01/26/2017 - Review Complete 01/26/2017  Allergen Reaction Noted  . Atorvastatin  07/26/2015  . Esomeprazole magnesium  07/26/2015    Review of Systems:    All systems reviewed and negative except where noted in HPI.   Physical Exam:  Vital signs in last 24 hours: Temp:  [97.1 F (36.2 C)-98.2 F (36.8 C)] 97.7 F (36.5 C) (05/22 1954) Pulse Rate:  [70-84] 84 (05/22 1954) Resp:  [18-29] 23 (05/22 1954) BP: (93-114)/(51-70) 97/52 (05/22 1954) SpO2:  [92 %-99 %] 94 % (05/22 1954)   General:   Pleasant, cooperative in NAD Head:  Normocephalic and atraumatic. Eyes:   No icterus.   Conjunctiva pink. PERRLA. Ears:  Normal auditory acuity. Neck:  Supple; no masses or thyroidomegaly Lungs: Respirations even and unlabored. Lungs clear to auscultation bilaterally.   No wheezes, crackles, or rhonchi.  Heart:  Regular rate and rhythm;  Without murmur, clicks, rubs or gallops Abdomen:  Soft, nondistended, nontender. Normal bowel sounds. No appreciable masses or hepatomegaly.  No rebound or guarding.  Rectal:  Not performed. Msk:  Symmetrical without gross deformities.    Extremities:  Without edema, cyanosis or clubbing. Neurologic:  Alert and oriented x3;  grossly normal neurologically. Skin:  Intact without significant lesions or rashes. Cervical Nodes:  No significant cervical adenopathy. Psych:  Alert and cooperative. Normal affect.  LAB RESULTS:  Recent Labs  01/26/17 1423 01/27/17 0515  WBC 11.0* 8.4  HGB 11.4* 10.4*  HCT 35.4* 31.8*  PLT 126* 69*   BMET  Recent Labs  01/26/17 1423 01/27/17 0515  NA 140 144  K 5.2* 4.0  CL 105 111  CO2 21* 25  GLUCOSE 183* 152*  BUN 65* 62*  CREATININE 2.38* 1.67*  CALCIUM 8.5* 8.4*   LFT  Recent Labs  01/27/17 0515  PROT 5.9*  ALBUMIN 3.1*  AST 1,392*  ALT 1,191*  ALKPHOS 56  BILITOT 2.3*   PT/INR No results for input(s): LABPROT, INR in the  last 72 hours.  STUDIES: US Abdomen Complete  Result Date: 01/26/2017 CLINICAL DATA:  Acute onset of elevated LFTs.  Initial encounter. EXAM: ABDOMEN ULTRASOUND COMPLETE COMPARISON:  CT of the abdomen and pelvis from 08/09/2015 FINDINGS: Gallbladder: Diffuse gallbladder wall thickening is noted, measuring up to 6 mm, with mild echogenic sludge noted in the gallbladder. No definite stones are appreciated. No ultrasonographic Murphy's sign is elicited. No pericholecystic fluid is seen. Common bile duct: Diameter: 0.6 cm, within normal limits in caliber. Liver: No focal lesion identified. Scattered calcified granulomata are noted within the liver. Within normal limits in parenchymal  echogenicity. IVC: No abnormality visualized. Pancreas: Not visualized. Spleen: Size and appearance within normal limits. Right Kidney: Length: 11.3 cm. Echogenicity within normal limits. No mass or hydronephrosis visualized. Left Kidney: Length: 11.0 cm. Echogenicity within normal limits. No mass or hydronephrosis visualized. Abdominal aorta: No aneurysm visualized. Partially obscured by overlying bowel gas. Other findings: A small right pleural effusion is noted. IMPRESSION: 1. Diffuse gallbladder wall thickening, measuring up to 6 mm, with mild echogenic sludge. No ultrasonographic Murphy's sign elicited. This may reflect chronic inflammation. 2. Small right pleural effusion. Electronically Signed   By: Roanna Raider M.D.   On: 01/26/2017 18:05   Dg Chest Port 1 View  Result Date: 01/27/2017 CLINICAL DATA:  Respiratory failure. EXAM: PORTABLE CHEST 1 VIEW COMPARISON:  01/26/2017. FINDINGS: Cardiac pacer in stable position. Cardiomegaly with diffuse bilateral pulmonary infiltrates/edema. No interim change. No pleural effusion or pneumothorax . IMPRESSION: 1.  Stable cardiomegaly.  Cardiac pacer in stable position. 2. Diffuse bilateral pulmonary infiltrates/ edema. No interim change. Electronically Signed   By: Maisie Fus  Register    On: 01/27/2017 06:34   Dg Chest Port 1 View  Result Date: 01/26/2017 CLINICAL DATA:  Altered mental status EXAM: PORTABLE CHEST 1 VIEW COMPARISON:  03/17/2016 FINDINGS: Cardiomegaly and vascular pedicle widening. Diffuse interstitial and airspace opacity. No effusion or pneumothorax. Dual-chamber pacer leads from the left in stable position. IMPRESSION: Diffuse symmetric lung opacity favoring CHF. Electronically Signed   By: Marnee Spring M.D.   On: 01/26/2017 14:42      Impression / Plan:   Johnny Bowen is a 83 y.o. y/o male with abnormal liver enzymes that have greatly improved overnight.  To have liver enzymes over thousand typically indicates an acute viral infection versus shock liver from ischemia versus a drug reaction.  The patient has had his acute hepatitis panel sent off. It was negative for hepatitis A, B and C.  From the patient's clinical picture I believe that the patient may have become hypotensive at some time during this acute illness.  The labs should improve over the next few days with just supportive care.  Thank you for involving me in the care of this patient.      LOS: 1 day   [name], MD  01/27/2017, 8:19 PM   Note: This dictation was prepared with Dragon dictation along with smaller phrase technology. Any transcriptional errors that result from this process are unintentional.

## 2017-01-28 ENCOUNTER — Inpatient Hospital Stay: Payer: Medicare Other

## 2017-01-28 DIAGNOSIS — Z515 Encounter for palliative care: Secondary | ICD-10-CM

## 2017-01-28 DIAGNOSIS — R4182 Altered mental status, unspecified: Secondary | ICD-10-CM

## 2017-01-28 DIAGNOSIS — K7200 Acute and subacute hepatic failure without coma: Secondary | ICD-10-CM

## 2017-01-28 DIAGNOSIS — Z7189 Other specified counseling: Secondary | ICD-10-CM

## 2017-01-28 LAB — COMPREHENSIVE METABOLIC PANEL
ALT: 1346 U/L — AB (ref 17–63)
AST: 1250 U/L — ABNORMAL HIGH (ref 15–41)
Albumin: 2.8 g/dL — ABNORMAL LOW (ref 3.5–5.0)
Alkaline Phosphatase: 62 U/L (ref 38–126)
Anion gap: 7 (ref 5–15)
BUN: 46 mg/dL — ABNORMAL HIGH (ref 6–20)
CO2: 28 mmol/L (ref 22–32)
CREATININE: 1.09 mg/dL (ref 0.61–1.24)
Calcium: 7.9 mg/dL — ABNORMAL LOW (ref 8.9–10.3)
Chloride: 106 mmol/L (ref 101–111)
GFR calc non Af Amer: 60 mL/min — ABNORMAL LOW (ref >60)
Glucose, Bld: 109 mg/dL — ABNORMAL HIGH (ref 65–99)
Potassium: 3.2 mmol/L — ABNORMAL LOW (ref 3.5–5.1)
SODIUM: 141 mmol/L (ref 135–145)
Total Bilirubin: 1.9 mg/dL — ABNORMAL HIGH (ref 0.3–1.2)
Total Protein: 5.7 g/dL — ABNORMAL LOW (ref 6.5–8.1)

## 2017-01-28 LAB — GLUCOSE, CAPILLARY
GLUCOSE-CAPILLARY: 100 mg/dL — AB (ref 65–99)
Glucose-Capillary: 107 mg/dL — ABNORMAL HIGH (ref 65–99)
Glucose-Capillary: 114 mg/dL — ABNORMAL HIGH (ref 65–99)
Glucose-Capillary: 110 mg/dL — ABNORMAL HIGH (ref 65–99)

## 2017-01-28 LAB — PROTIME-INR
INR: 1.89
Prothrombin Time: 22 seconds — ABNORMAL HIGH (ref 11.4–15.2)

## 2017-01-28 LAB — CBC
HCT: 29.8 % — ABNORMAL LOW (ref 40.0–52.0)
Hemoglobin: 9.8 g/dL — ABNORMAL LOW (ref 13.0–18.0)
MCH: 26.3 pg (ref 26.0–34.0)
MCHC: 33 g/dL (ref 32.0–36.0)
MCV: 79.9 fL — ABNORMAL LOW (ref 80.0–100.0)
PLATELETS: 72 10*3/uL — AB (ref 150–440)
RBC: 3.73 MIL/uL — AB (ref 4.40–5.90)
RDW: 15.7 % — ABNORMAL HIGH (ref 11.5–14.5)
WBC: 9.1 10*3/uL (ref 3.8–10.6)

## 2017-01-28 LAB — PROCALCITONIN: Procalcitonin: 1.67 ng/mL

## 2017-01-28 LAB — TROPONIN I: Troponin I: 1.24 ng/mL (ref <0.03)

## 2017-01-28 LAB — BRAIN NATRIURETIC PEPTIDE: B Natriuretic Peptide: 1335 pg/mL — ABNORMAL HIGH (ref 0.0–100.0)

## 2017-01-28 MED ORDER — PRAMIPEXOLE DIHYDROCHLORIDE 1 MG PO TABS
2.0000 mg | ORAL_TABLET | Freq: Two times a day (BID) | ORAL | Status: DC
  Administered 2017-01-28 – 2017-01-30 (×5): 2 mg via ORAL
  Filled 2017-01-28 (×5): qty 2

## 2017-01-28 MED ORDER — DOXEPIN HCL 50 MG PO CAPS
150.0000 mg | ORAL_CAPSULE | Freq: Every evening | ORAL | Status: DC | PRN
  Filled 2017-01-28: qty 3

## 2017-01-28 MED ORDER — CARBIDOPA-LEVODOPA 25-100 MG PO TABS
1.0000 | ORAL_TABLET | Freq: Three times a day (TID) | ORAL | Status: DC
  Administered 2017-01-28 – 2017-01-30 (×6): 1 via ORAL
  Filled 2017-01-28 (×7): qty 1

## 2017-01-28 MED ORDER — PREGABALIN 50 MG PO CAPS
100.0000 mg | ORAL_CAPSULE | Freq: Every day | ORAL | Status: DC
  Administered 2017-01-28 – 2017-01-29 (×2): 100 mg via ORAL
  Filled 2017-01-28 (×2): qty 2

## 2017-01-28 MED ORDER — POTASSIUM CHLORIDE CRYS ER 20 MEQ PO TBCR
20.0000 meq | EXTENDED_RELEASE_TABLET | Freq: Once | ORAL | Status: AC
  Administered 2017-01-28: 20 meq via ORAL
  Filled 2017-01-28: qty 1

## 2017-01-28 MED ORDER — PRAMIPEXOLE DIHYDROCHLORIDE 1 MG PO TABS: 2.0000 mg | ORAL_TABLET | Freq: Two times a day (BID) | ORAL | Status: DC

## 2017-01-28 NOTE — Consult Note (Signed)
Consultation Note Date: 01/28/2017   Patient Name: Johnny Bowen  DOB: 02-29-1930  MRN: [id number]  Age / Sex: 83 y.o., male  PCP: Johnny Kayser, MD Referring Physician: Dustin Flock, MD  Reason for Consultation: Establishing goals of care  HPI/Patient Profile: 83 y.o. male  with past medical history of parkinsons, diabetes with neuropathic complications, recurrent UTIs, CAD w/hx of heart block, p-afib, and pacemaker placement, who was admitted on 01/26/2017 with severe sepsis, (AMS, elevated lactic acid, shocked liver, elevated troponin and acute liver failure).  The source of his sepsis is possibly pneumonia +/- cholecystitis.  PMT was requested by Johnny Bowen wife for goals of care.   Clinical Assessment and Goals of Care:  I have reviewed medical records including EPIC notes, labs and imaging, received report from the care team, assessed the patient and then met at the bedside along with his wife to discuss diagnosis prognosis, GOC, EOL wishes, disposition and options.  I introduced Palliative Medicine as specialized medical care for people living with serious illness. It focuses on providing relief from the symptoms and stress of a serious illness. The goal is to improve quality of life for both the patient and the family.  We discussed a brief life review of the patient.  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  He has two children (one son and one dtr).  He married Johnny Bowen approximately 30 years ago.  He was diagnosed with Parkinsons in 2015.  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    Then I assessed functional and nutritional status at home by gathering history. He was able to walk short distances with a rolling walker.  He has difficulty with neuropathic pain and insomnia secondary to restless leg.  His appetite has been very limited  "picking" for many weeks.    We discussed his current illness (sepsis with ARF, ALF, AMS, Cardiac strain) and what it means in the larger context of parkinsons.  Often insults such as a severe illness will progress neurological diseases.  Natural disease trajectory and expectations at EOL were discussed. Johnny Bowen' father died of Parkinson with aspiration pneumonia so she understands what happens at EOL in parkinson's patients.  I attempted to elicit values and goals of care important to the patient.  He is a DNR/DNI.  He has clearly stated that he does not want artificial feeding.  Per his wife - he does not want to lay around and merely survive.  He would rather not dwindle - but rather face the EOL head on and go thru it without prolongation.  The difference between aggressive medical intervention and comfort care was considered in light of the patient's goals of care.   Hospice and Palliative Care services outpatient were explained and offered. Johnny Bowen is open to Hospice services and a comfort path - but she would like to have a conversation with her husband about it before fully pursuing comfort care.  Questions and concerns  were addressed.  Hard Choices booklet left for review. The family was encouraged to call with questions or concerns.  PMT will continue to support holistically.   Primary Decision Maker:  PATIENT and wife.    SUMMARY OF RECOMMENDATIONS    Full scope treatment for now.  Patient is DNR/DNI and would not want artificial feeding. He has expressed the desire to not prolong his parkinson's illness in the past.  There fore his wife believes he will want hospice care at home and she is open to Hospice services and a comfort path - but, she would like to have a conversation with her husband about it before fully pursuing comfort measures.  PMT will revisit the Johnny Bowen family tomorrow for further define their [place].  Code Status/Advance Care Planning:  DNR    Symptom  Management:   Per primary team - but please err on the side of comfort.  Palliative Prophylaxis:   Aspiration, Bowel Regimen, Frequent Pain Assessment and Turn Reposition  Psycho-social/Spiritual:   Desire for further Chaplaincy support: yes.  Requested.  Prognosis:   < 6 months secondary to rapid disease progression needing major assistance with ADLs, and now having critical nutritional impairment as well as life threatening complications including:  Sepsis, recurrent UTIs and likely aspiration pneumonia.  Discharge Planning: Home with Hospice most likely - but still to be determined.  Johnny Bowen has been quite clear that he will not go to a nursing facility even short term.      Primary Diagnoses: Present on Admission: . Sepsis ([place])   I have reviewed the medical record, interviewed the patient and family, and examined the patient. The following aspects are pertinent.  Past Medical History:  Diagnosis Date  . Allergic rhinitis   . Anemia   . Arthritis   . CAD (coronary artery disease)   . Diabetes mellitus with neurological manifestations ([place])   . Episodic atrial fibrillation ([place])   . GERD (gastroesophageal reflux disease)   . Heart block   . History of lower GI bleeding   . HLD (hyperlipidemia)   . HTN (hypertension)   . MI (myocardial infarction) ([place])   . Parkinson disease ([place])   . Proteinuria   . RLS (restless legs syndrome)   . Shingles    Social History   Social History  . Marital status: Married    Spouse name: N/A  . Number of children: N/A  . Years of education: N/A   Social History Main Topics  . Smoking status: Former Smoker    Quit date: 07/31/1995  . Smokeless tobacco: Never Used  . Alcohol use No  . Drug use: No  . Sexual activity: Not Asked   Other Topics Concern  . None   Social History Narrative  . None   Family History  Problem Relation Age of Onset  . Kidney disease Neg Hx   . Prostate cancer Neg Hx    Scheduled  Meds: . carbidopa-levodopa  1 tablet Oral TID  . doxycycline  100 mg Oral Q12H  . insulin aspart  0-9 Units Subcutaneous TID WC  . latanoprost  1 drop Both Eyes QHS  . pantoprazole  40 mg Oral Daily  . pramipexole  2 mg Oral BID  . pregabalin  100 mg Oral QHS   Continuous Infusions: . ampicillin-sulbactam (UNASYN) IV Stopped (01/28/17 0957)   PRN Meds:.doxepin, ipratropium-albuterol, [DISCONTINUED] ondansetron **OR** ondansetron (ZOFRAN) IV, oxyCODONE Allergies  Allergen Reactions  . Atorvastatin     Other reaction(s): Other (  See Comments) Restless leg syndrome  . Esomeprazole Magnesium     Other reaction(s): Other (See Comments) Stomach irritation   Review of Systems patient sleeping / lethargic  Physical Exam  Elderly frail man, sleeping soundly.  Does not wake to voice.  Vital Signs: BP 112/66 (BP Location: Right Arm)   Pulse 73   Temp 97.6 F (36.4 C) (Oral)   Resp (!) 21   Ht 5' 10" (1.778 m)   Wt 71.2 kg (156 lb 15.5 oz)   SpO2 99%   BMI 22.52 kg/m  Pain Assessment: 0-10 POSS *See Group Information*: 1-Acceptable,Awake and alert Pain Score: 8    SpO2: SpO2: 99 % O2 Device:SpO2: 99 % O2 Flow Rate: .O2 Flow Rate (L/min): 2 L/min  IO: Intake/output summary:  Intake/Output Summary (Last 24 hours) at 01/28/17 1344 Last data filed at 01/28/17 0957  Gross per 24 hour  Intake              320 ml  Output             1725 ml  Net            -1405 ml    LBM: Last BM Date: 01/25/17 Baseline Weight: Weight: 65.8 kg (145 lb) Most recent weight: Weight: 71.2 kg (156 lb 15.5 oz)     Palliative Assessment/Data:     Time In: 2:00 Time Out: 3:40 Time Total: 100 min. Greater than 50%  of this time was spent counseling and coordinating care related to the above assessment and plan.  Signed by: Imogene Burn, PA-C Palliative Medicine Pager: [phone]  Please contact Palliative Medicine Team phone at [phone] for questions and concerns.  For individual  provider: See Shea Evans

## 2017-01-28 NOTE — Progress Notes (Signed)
Pharmacy Antibiotic Note  Johnny Bowen is a 83 y.o. male admitted on 01/26/2017 with pneumonia.  Pharmacy has been consulted for Unasyn dosing. Patient is also ordered doxycycline 100mg  PO BID.   Plan: Unasyn 3g IV Q6hr.   Height: 5\' 10"  (177.8 cm) Weight: 156 lb 15.5 oz (71.2 kg) IBW/kg (Calculated) : 73  Temp (24hrs), Avg:97.8 F (36.6 C), Min:97.7 F (36.5 C), Max:97.9 F (36.6 C)   Recent Labs Lab 01/26/17 1423 01/26/17 1706 01/27/17 0515 01/28/17 0511  WBC 11.0*  --  8.4 9.1  CREATININE 2.38*  --  1.67* 1.09  LATICACIDVEN 4.1* 3.6*  --   --     Estimated Creatinine Clearance: 49.9 mL/min (by C-G formula based on SCr of 1.09 mg/dL).    Allergies  Allergen Reactions  . Atorvastatin     Other reaction(s): Other (See Comments) Restless leg syndrome  . Esomeprazole Magnesium     Other reaction(s): Other (See Comments) Stomach irritation    Antimicrobials this admission: Azithromycin 5/21 >> 5/21 Ceftriaxone 5/21 >> 5/21 Unasyn 5/22 >>  Doxycycline 5/22 >>  Dose adjustments this admission: N/A  Microbiology results: 5/21 BCx: no growth x 2  5/21 UCx: no growth   Thank you for allowing pharmacy to be a part of this patient's care.  Destin Kittler L 01/28/2017 9:04 AM

## 2017-01-28 NOTE — Progress Notes (Signed)
[facility] [place] [facility]   PATIENT NAME: Johnny Bowen    MR#:  [id number]  DATE OF BIRTH:  [birth year]  SUBJECTIVE:   Patient feeling better but has pain in his legs,     REVIEW OF SYSTEMS:   Review of Systems  Constitutional: Negative for chills, fever and weight loss.  HENT: Negative for ear discharge, ear pain and nosebleeds.   Eyes: Negative for blurred vision, pain and discharge.  Respiratory: Positive for shortness of breath. Negative for sputum production, wheezing and stridor.   Cardiovascular: Negative for chest pain, palpitations, orthopnea and PND.  Gastrointestinal: Negative for abdominal pain, diarrhea, nausea and vomiting.  Genitourinary: Negative for frequency and urgency.  Musculoskeletal: Negative for back pain and joint pain.  Neurological: Positive for weakness. Negative for sensory change, speech change and focal weakness.  Psychiatric/Behavioral: Negative for depression and hallucinations. The patient is not nervous/anxious.    Tolerating Diet:yes Tolerating PT: pending  DRUG ALLERGIES:   Allergies  Allergen Reactions  . Atorvastatin     Other reaction(s): Other (See Comments) Restless leg syndrome  . Esomeprazole Magnesium     Other reaction(s): Other (See Comments) Stomach irritation    VITALS:  Blood pressure 112/66, pulse 73, temperature 97.6 F (36.4 C), temperature source Oral, resp. rate (!) 21, height 5\' 10"  (1.778 m), weight 156 lb 15.5 oz (71.2 kg), SpO2 99 %.  PHYSICAL EXAMINATION:   Physical Exam  GENERAL:  83 y.o.-year-old patient lying in the bed with no acute distress. thin EYES: Pupils equal, round, reactive to light and accommodation. No scleral icterus. Extraocular muscles intact.  HEENT: Head atraumatic, normocephalic. Oropharynx and nasopharynx clear.  NECK:  Supple, no jugular venous distention. No thyroid enlargement, no tenderness.  LUNGS: Normal breath sounds bilaterally, no  wheezing, rales, rhonchi. No use of accessory muscles of respiration.  CARDIOVASCULAR: S1, S2 normal. No murmurs, rubs, or gallops.  ABDOMEN: Soft, nontender, nondistended. Bowel sounds present. No organomegaly or mass.  EXTREMITIES: No cyanosis, clubbing or edema b/l.    NEUROLOGIC: Cranial nerves II through XII are intact. No focal Motor or sensory deficits b/l.  weak PSYCHIATRIC:  patient is alert and oriented x 2 SKIN: No obvious rash, lesion, or ulcer.   LABORATORY PANEL:  CBC  Recent Labs Lab 01/28/17 0511  WBC 9.1  HGB 9.8*  HCT 29.8*  PLT 72*    Chemistries   Recent Labs Lab 01/28/17 0511  NA 141  K 3.2*  CL 106  CO2 28  GLUCOSE 109*  BUN 46*  CREATININE 1.09  CALCIUM 7.9*  AST 1,250*  ALT 1,346*  ALKPHOS 62  BILITOT 1.9*   Cardiac Enzymes  Recent Labs Lab 01/28/17 0511  TROPONINI 1.24*   RADIOLOGY:  US Abdomen Complete  Result Date: 01/26/2017 CLINICAL DATA:  Acute onset of elevated LFTs.  Initial encounter. EXAM: ABDOMEN ULTRASOUND COMPLETE COMPARISON:  CT of the abdomen and pelvis from 08/09/2015 FINDINGS: Gallbladder: Diffuse gallbladder wall thickening is noted, measuring up to 6 mm, with mild echogenic sludge noted in the gallbladder. No definite stones are appreciated. No ultrasonographic Murphy's sign is elicited. No pericholecystic fluid is seen. Common bile duct: Diameter: 0.6 cm, within normal limits in caliber. Liver: No focal lesion identified. Scattered calcified granulomata are noted within the liver. Within normal limits in parenchymal echogenicity. IVC: No abnormality visualized. Pancreas: Not visualized. Spleen: Size and appearance within normal limits. Right Kidney: Length: 11.3 cm. Echogenicity within normal limits. No mass or hydronephrosis visualized.  Left Kidney: Length: 11.0 cm. Echogenicity within normal limits. No mass or hydronephrosis visualized. Abdominal aorta: No aneurysm visualized. Partially obscured by overlying bowel gas.  Other findings: A small right pleural effusion is noted. IMPRESSION: 1. Diffuse gallbladder wall thickening, measuring up to 6 mm, with mild echogenic sludge. No ultrasonographic Murphy's sign elicited. This may reflect chronic inflammation. 2. Small right pleural effusion. Electronically Signed   By: Roanna Raider M.D.   On: 01/26/2017 18:05   Dg Chest Port 1 View  Result Date: 01/28/2017 CLINICAL DATA:  Respiratory failure. EXAM: PORTABLE CHEST 1 VIEW COMPARISON:  01/27/2017. FINDINGS: Cardiac pacer stable position. Cardiomegaly with diffuse bilateral pulmonary infiltrates/edema. No interim change. No pleural effusion or pneumothorax. IMPRESSION: 1. Cardiac pacer stable position. 2. Cardiomegaly with bilateral pulmonary infiltrates/ edema. No interim change from prior exam . Electronically Signed   By: Maisie Fus  Register   On: 01/28/2017 07:26   Dg Chest Port 1 View  Result Date: 01/27/2017 CLINICAL DATA:  Respiratory failure. EXAM: PORTABLE CHEST 1 VIEW COMPARISON:  01/26/2017. FINDINGS: Cardiac pacer in stable position. Cardiomegaly with diffuse bilateral pulmonary infiltrates/edema. No interim change. No pleural effusion or pneumothorax . IMPRESSION: 1.  Stable cardiomegaly.  Cardiac pacer in stable position. 2. Diffuse bilateral pulmonary infiltrates/ edema. No interim change. Electronically Signed   By: Maisie Fus  Register   On: 01/27/2017 06:34   Dg Chest Port 1 View  Result Date: 01/26/2017 CLINICAL DATA:  Altered mental status EXAM: PORTABLE CHEST 1 VIEW COMPARISON:  03/17/2016 FINDINGS: Cardiomegaly and vascular pedicle widening. Diffuse interstitial and airspace opacity. No effusion or pneumothorax. Dual-chamber pacer leads from the left in stable position. IMPRESSION: Diffuse symmetric lung opacity favoring CHF. Electronically Signed   By: Marnee Spring M.D.   On: 01/26/2017 14:42   ASSESSMENT AND PLAN:   Johnny Bowen  is a 83 y.o. male with a known history of Allergic rhinitis, coronary  artery disease, diabetes type 2, GERD, history of pacemaker, essential hypertension, Parkinson's disease who is brought in by his wife due to change in mental status. And shortness of breath. Patient for the past few months has not been eating or drinking much. He was found to have elevated lactic acid level possible pneumonia  1. Sever Sepsis suspected due to possible pneumonia Continue Unasyn for now   2. Acute liver injury appears from severe sepsis with hypotension Differential diagnoses include possible hypotension, shock liver, Chronic choleastasis Continue to hold amiodarone, elquis and his cholesterol medications Due to concern for chronic cholecystitis I will order a HIDA scan for tomorrow  3. Acute renal failure  -Resolved I will stop IV fluids due to worsening chest x-ray   4. Atrial fibrillation  -holding amiodarone and eliquis -HR in the 70's  5. Hyperlipidemia unspecified will hold cholesterol medication  6. TCP plt count 126---69 K (?sepsis) SCD for now  7.Elevated troponin suspect demand ischemia from sepsis and hypotension -pt has no CP Echo pending   8. Acute respiratory failure Patient's oxygen requirement is less. Chest x-ray showed bilateral infiltrate Await echo result,  Monitor respiratory status  Wife requests palliative care to follow medical home  Case discussed with Care Management/Social Worker. Management plans discussed with the patient, family and they are in agreement.  CODE STATUS: DNR  DVT Prophylaxis: SCD due to drop in plts  TOTAL TIME TAKING CARE OF THIS PATIENT: 32 minutes.  >50% time spent on counselling and coordination of care pt and sister  POSSIBLE D/C IN 2-3 DAYS, DEPENDING ON CLINICAL  CONDITION.  Note: This dictation was prepared with Dragon dictation along with smaller phrase technology. Any transcriptional errors that result from this process are unintentional.  Auburn Bilberry M.D on 01/28/2017 at 2:16 PM  Between  7am to 6pm - Pager - [phone]  After 6pm go to [email or web address] - Social research officer, government  [facility]  Office  [phone]  CC: Primary care physician; Mickey Farber, MD

## 2017-01-28 NOTE — Progress Notes (Signed)
Patients wife voiced concerns regarding her ability to care for her husband at home with Clinical research associate. She also discussed her concern for her husbands quality of life and neuropathy pain control. MD to be made aware of need for palliative consult and/or placement. Bo Mcclintock, RN

## 2017-01-28 NOTE — Progress Notes (Signed)
CH responded to a referral from a PA to room 107. Pt was awake and alert. Wife and step daughter were bedside. Wife is contemplating the wisdom of sharing medical information with her step sons who have a tendency to over react. CH suggested having the MD present to present the information. Wife agreed. CH is available for follow up as needed.    01/28/17 1500  Clinical Encounter Type  Visited With Patient;Patient and family together;Health care provider  Visit Type Initial;Spiritual support  Referral From Physician  Spiritual Encounters  Spiritual Needs Emotional     01/28/17 1500  Clinical Encounter Type  Visited With Patient;Patient and family together;Health care provider  Visit Type Initial;Spiritual support  Referral From Physician  Spiritual Encounters  Spiritual Needs Emotional

## 2017-01-29 ENCOUNTER — Other Ambulatory Visit: Payer: Medicare Other

## 2017-01-29 LAB — CBC
HCT: 31.6 % — ABNORMAL LOW (ref 40.0–52.0)
HEMOGLOBIN: 10.2 g/dL — AB (ref 13.0–18.0)
MCH: 26.1 pg (ref 26.0–34.0)
MCHC: 32.3 g/dL (ref 32.0–36.0)
MCV: 80.9 fL (ref 80.0–100.0)
Platelets: 66 10*3/uL — ABNORMAL LOW (ref 150–440)
RBC: 3.9 MIL/uL — AB (ref 4.40–5.90)
RDW: 16.3 % — ABNORMAL HIGH (ref 11.5–14.5)
WBC: 6.1 10*3/uL (ref 3.8–10.6)

## 2017-01-29 LAB — COMPREHENSIVE METABOLIC PANEL
ALBUMIN: 2.6 g/dL — AB (ref 3.5–5.0)
ALK PHOS: 58 U/L (ref 38–126)
ALT: 72 U/L — ABNORMAL HIGH (ref 17–63)
AST: 566 U/L — AB (ref 15–41)
Anion gap: 6 (ref 5–15)
BUN: 33 mg/dL — ABNORMAL HIGH (ref 6–20)
CALCIUM: 8 mg/dL — AB (ref 8.9–10.3)
CO2: 31 mmol/L (ref 22–32)
Chloride: 108 mmol/L (ref 101–111)
Creatinine, Ser: 0.7 mg/dL (ref 0.61–1.24)
GFR calc Af Amer: >60 mL/min (ref >60)
GFR calc non Af Amer: >60 mL/min (ref >60)
Glucose, Bld: 99 mg/dL (ref 65–99)
Potassium: 3.4 mmol/L — ABNORMAL LOW (ref 3.5–5.1)
SODIUM: 145 mmol/L (ref 135–145)
Total Bilirubin: 1.8 mg/dL — ABNORMAL HIGH (ref 0.3–1.2)
Total Protein: 5.3 g/dL — ABNORMAL LOW (ref 6.5–8.1)

## 2017-01-29 LAB — GLUCOSE, CAPILLARY: Glucose-Capillary: 110 mg/dL — ABNORMAL HIGH (ref 65–99)

## 2017-01-29 MED ORDER — OXYCODONE HCL 5 MG PO TABS
5.0000 mg | ORAL_TABLET | ORAL | Status: DC | PRN
  Administered 2017-01-29 – 2017-01-30 (×5): 5 mg via ORAL
  Filled 2017-01-29 (×5): qty 1

## 2017-01-29 MED ORDER — MORPHINE SULFATE (PF) 2 MG/ML IV SOLN
2.0000 mg | INTRAVENOUS | Status: DC | PRN
  Administered 2017-01-29 (×2): 2 mg via INTRAVENOUS
  Filled 2017-01-29 (×2): qty 1

## 2017-01-29 NOTE — Progress Notes (Addendum)
Daily Progress Note   Patient Name: Johnny Bowen       Date: 01/29/2017 DOB: Feb 26, 1930  Age: 83 y.o. MRN#: [id number] Attending Physician: Johnny Bilberry, MD Primary Care Physician: Johnny Farber, MD Admit Date: 01/26/2017  Reason for Consultation/Follow-up: Establishing goals of care  Subjective: Spoke with patient at bedside.  He is exhausted, but tells me he wants to go home.  He also states that he is hungry - but not eating due to a test scheduled for this morning.  I asked if he would accept surgery if the test indicated it was needed.  He replied that he would not undergo surgery no matter what the test indicated.  We discussed how he would like to spend his time - comfort vs more aggressive medical care.  He states he would like to spend his time at home with his family rather than pursuing more medical care to push to live more days.    We talked about home Hospice services - he stated he was a private man and did not want a lot of people in his house.  His wife and son came in - Johnny Bowen gave me a completed MOST form:   COMFORT MEASURES ONLY.  She stated they discussed it yesterday.  I took Johnny Bowen (son) and his friend Johnny Bowen into another room and talked with them about his father's health, prognosis and end of life.  Johnny Bowen was very tearful, but appreciative.  He understands his father's time is very limited and wants to support him as much as possible.  I then talked with Johnny Bowen further - she requested [facility] services in her home and an Ambulance ride home in the morning for her husband.  She requested a physical therapy evaluation to determine how weak Johnny Bowen is and how much support and equipment he will need.     Assessment: 83 yo male with advanced parkinsons.   Admitted with severe sepsis and multi organ system failure.  Now improving from sepsis but very weak.  Has chosen comfort measures going forward.   Patient Profile/HPI: 83 y.o. male  with past medical history of parkinsons, diabetes with neuropathic complications, recurrent UTIs, CAD w/hx of heart block, p-afib, and pacemaker placement, who was admitted on 01/26/2017 with severe sepsis, (AMS, elevated lactic acid, shocked liver, elevated  troponin and acute liver failure).  The source of his sepsis is possibly pneumonia +/- cholecystitis.  PMT was requested by Johnny Bowen wife for goals of care.    Length of Stay: 3  Current Medications: Scheduled Meds:  . carbidopa-levodopa  1 tablet Oral TID  . doxycycline  100 mg Oral Q12H  . latanoprost  1 drop Both Eyes QHS  . pantoprazole  40 mg Oral Daily  . pramipexole  2 mg Oral BID  . pregabalin  100 mg Oral QHS    Continuous Infusions: . ampicillin-sulbactam (UNASYN) IV Stopped (01/29/17 0837)    PRN Meds: doxepin, ipratropium-albuterol, morphine injection, [DISCONTINUED] ondansetron **OR** ondansetron (ZOFRAN) IV, oxyCODONE  Physical Exam        Elderly frail, pale well developed man.  A&O, coherent.  HOH and fatigued CV rrr Resp no distress    Vital Signs: BP (!) 112/53 (BP Location: Left Arm)   Pulse 82   Temp 97.8 F (36.6 C) (Oral)   Resp 20   Ht 5\' 10"  (1.778 m)   Wt 71.2 kg (156 lb 15.5 oz)   SpO2 97%   BMI 22.52 kg/m  SpO2: SpO2: 97 % O2 Device: O2 Device: Nasal Cannula O2 Flow Rate: O2 Flow Rate (L/min): 2 L/min  Intake/output summary:   Intake/Output Summary (Last 24 hours) at 01/29/17 1136 Last data filed at 01/29/17 0158  Gross per 24 hour  Intake              220 ml  Output              400 ml  Net             -180 ml   LBM: Last BM Date: 01/25/17 Baseline Weight: Weight: 65.8 kg (145 lb) Most recent weight: Weight: 71.2 kg (156 lb 15.5 oz)       Palliative Assessment/Data:    Flowsheet Rows      Most Recent Value  Intake Tab  Clinical Assessment  Palliative Performance Scale Score  30%  Pain Max last 24 hours  8  Pain Min Last 24 hours  4  Psychosocial & Spiritual Assessment  Palliative Care Outcomes  Patient/Family meeting held?  Yes  Who was at the meeting?  patient and wife  Palliative Care Outcomes  Clarified goals of care, Counseled regarding hospice  Patient/Family wishes: Interventions discontinued/not started   PEG, Tube feedings/TPN      Patient Active Problem List   Diagnosis Date Noted  . Altered mental status   . Acute liver failure without hepatic coma   . Palliative care encounter   . Goals of care, counseling/discussion   . Elevated liver enzymes   . Sepsis (HCC) 01/26/2017  . Pain in limb 01/16/2017  . Microscopic hematuria 12/10/2015  . Recurrent UTI 12/10/2015  . Right ureteral stone 12/10/2015  . HB (heart block) 07/26/2015  . Myocardial infarction (HCC) 07/26/2015  . Parkinson's disease (HCC) 04/07/2014  . Absolute anemia 03/06/2014  . Paroxysmal atrial fibrillation (HCC) 03/06/2014  . Abnormal presence of protein in urine 03/06/2014  . Allergic rhinitis 03/04/2014  . Arteriosclerosis of coronary artery 03/04/2014  . Diabetes mellitus (HCC) 03/04/2014  . Benign essential HTN 03/04/2014  . Acid reflux 03/04/2014  . HLD (hyperlipidemia) 03/04/2014  . Restless leg 03/04/2014  . Sensory neuropathy 03/04/2014    Palliative Care Plan    Recommendations/Plan:  Home with Hospice Services  PT eval ordered to help with discharge planning and needs at home.  Goals of Care and Additional Recommendations:  Limitations on Scope of Treatment: Avoid Hospitalization, Full Comfort Care and Minimize Medications  Code Status:  DNR  Prognosis:   < 6 months based on advanced parkinsons with rapid decline, recurrent infections, life threatening sepsis.  Lack mobility.  Desire for comfort approach.   Discharge Planning:  Home with Hospice  Care  plan was discussed with patient and family, case management, Hospice Liaison, [facility] MD.  Thank you for allowing the Palliative Medicine Team to assist in the care of this patient.  Total time spent:  75 min. Time in 10:00 Time out:  11:15     Greater than 50%  of this time was spent counseling and coordinating care related to the above assessment and plan.  Johnny Downs, PA-C Palliative Medicine  Please contact Palliative MedicineTeam phone at [phone] for questions and concerns between 7 am - 7 pm.   Please see AMION for individual provider pager numbers.

## 2017-01-29 NOTE — Care Management (Signed)
Admitted to this facility with the diagnosis of sepsis. Lives with wife, [name], [phone]). Last seen Dr. Harrington Challenger 11/13/16.Last seen Dr. Sherryll Burger 12/19/16 (neurologist). History of Parkinson's  and dementia. Poor po intake for several weeks. Used a rolling walker to aid in ambulation in the home.  Receiving Unasyn IV. Temp = 97.9. Oxygen per nasal cannula in place. Palliative Care consult in progress. NPO. Family discussed goals of care yesterday with palliative representative. Possible Hospice in the home and comfort care to be determined. Gwenette Greet RN MSN CCM Care Management [phone]

## 2017-01-29 NOTE — Progress Notes (Signed)
The patient's liver enzymes have been coming down. With the totality of his symptoms and labs it is likely that this was due to shock liver. The patient's acute hepatitis panel was negative. Nothing further to do from a GI point of view except moderate his liver enzymes coming down. I will sign off.  Please call if any further GI concerns or questions.  We would like to thank you for the opportunity to participate in the care of Johnny Bowen.

## 2017-01-29 NOTE — Progress Notes (Signed)
New referral for [facility] services at home following discharge received from [facility]. Patient is an 83 year old man with a past medical history of Parkinsons, diabetes with neuropathic complications, recurrent UTIs, CAD with a history of heart block, paroxismal-afib, and pacemaker placement, who was admitted to [facility] on 01/26/2017 with severe sepsis, (AMS, elevated lactic acid, shocked liver, elevated troponin and acute liver failure).  The source of his sepsis is possibly pneumonia and/or cholecystitis. He is currently receiving IV antibiotics and is requiring IV morphine and oxycodone  for pain control. Palliative medicine consult was requested by family for Goals of Care. Palliative Medicine PA Imogene Burn met with patient and his wife on 5/23 and 5/24 and they have chosen to return home with hospice services. Writer attempted to meet in the room to speak with both the patient and his wife, however his wife preferred to talk outside of the room. Patient did not participate in the discussion.  Writer initiated education regarding hospice services, philosophy and team approach to care with understanding voiced. DME was discussed, at this time patient needs oxygen and a hospital bed in place prior to discharge. DME ordered for delivery before 12 noon tomorrow 5/25 as Mrs. Allbritton needs to move furniture prior to delivery. Hospice information and contact number given to Mrs. Daniel. Plan is for discharge home tomorrow after equipment hs been delivered. Patient will require EMS transport. Signed DNR and MOST form in place in patient's chart. Hospital care team updated. Will continue to follow through final disposition. Thank you. Flo Shanks RN, BSN, [facility] [place], hospital liaison [phone] c

## 2017-01-29 NOTE — Care Management (Signed)
Palliative care meeting with family. Decided to take Johnny Bowen home with Hospice services in the home.   Spoke with wife, [name], at the bedside. Discussed Hospice agencies. Would like [facility]. Dayna Barker, RN representative updated. Will give [name] personnel care service list. Gwenette Greet RN MSN CCM Care Management [phone]

## 2017-01-29 NOTE — Progress Notes (Signed)
[facility] [place] [facility]   PATIENT NAME: Johnny Bowen    MR#:  [id number]  DATE OF BIRTH:  Feb 18, 1930  SUBJECTIVE:   Pt c/o pain all over, uncomfortable     REVIEW OF SYSTEMS:   Review of Systems  Constitutional: Negative for chills, fever and weight loss.  HENT: Negative for ear discharge, ear pain and nosebleeds.   Eyes: Negative for blurred vision, pain and discharge.  Respiratory: Positive for shortness of breath. Negative for sputum production, wheezing and stridor.   Cardiovascular: Negative for chest pain, palpitations, orthopnea and PND.  Gastrointestinal: Negative for abdominal pain, diarrhea, nausea and vomiting.  Genitourinary: Negative for frequency and urgency.  Musculoskeletal: Negative for back pain and joint pain.  Neurological: Positive for weakness. Negative for sensory change, speech change and focal weakness.  Psychiatric/Behavioral: Negative for depression and hallucinations. The patient is not nervous/anxious.    Tolerating Diet:yes Tolerating PT: pending  DRUG ALLERGIES:   Allergies  Allergen Reactions  . Atorvastatin     Other reaction(s): Other (See Comments) Restless leg syndrome  . Esomeprazole Magnesium     Other reaction(s): Other (See Comments) Stomach irritation    VITALS:  Blood pressure (!) 112/53, pulse 82, temperature 97.8 F (36.6 C), temperature source Oral, resp. rate 20, height 5\' 10"  (1.778 m), weight 156 lb 15.5 oz (71.2 kg), SpO2 97 %.  PHYSICAL EXAMINATION:   Physical Exam  GENERAL:  83 y.o.-year-old patient lying in the bed with no acute distress. thin EYES: Pupils equal, round, reactive to light and accommodation. No scleral icterus. Extraocular muscles intact.  HEENT: Head atraumatic, normocephalic. Oropharynx and nasopharynx clear.  NECK:  Supple, no jugular venous distention. No thyroid enlargement, no tenderness.  LUNGS: Normal breath sounds bilaterally, no wheezing, rales,  rhonchi. No use of accessory muscles of respiration.  CARDIOVASCULAR: S1, S2 normal. No murmurs, rubs, or gallops.  ABDOMEN: Soft, nontender, nondistended. Bowel sounds present. No organomegaly or mass.  EXTREMITIES: No cyanosis, clubbing or edema b/l.    NEUROLOGIC: Cranial nerves II through XII are intact. No focal Motor or sensory deficits b/l.  weak PSYCHIATRIC:  patient is alert and oriented x 2 SKIN: No obvious rash, lesion, or ulcer.   LABORATORY PANEL:  CBC  Recent Labs Lab 01/29/17 0440  WBC 6.1  HGB 10.2*  HCT 31.6*  PLT 66*    Chemistries   Recent Labs Lab 01/29/17 0440  NA 145  K 3.4*  CL 108  CO2 31  GLUCOSE 99  BUN 33*  CREATININE 0.70  CALCIUM 8.0*  AST 566*  ALT 72*  ALKPHOS 58  BILITOT 1.8*   Cardiac Enzymes  Recent Labs Lab 01/28/17 0511  TROPONINI 1.24*   RADIOLOGY:  Dg Chest Port 1 View  Result Date: 01/28/2017 CLINICAL DATA:  Respiratory failure. EXAM: PORTABLE CHEST 1 VIEW COMPARISON:  01/27/2017. FINDINGS: Cardiac pacer stable position. Cardiomegaly with diffuse bilateral pulmonary infiltrates/edema. No interim change. No pleural effusion or pneumothorax. IMPRESSION: 1. Cardiac pacer stable position. 2. Cardiomegaly with bilateral pulmonary infiltrates/ edema. No interim change from prior exam . Electronically Signed   By: Maisie Fus  Register   On: 01/28/2017 07:26   ASSESSMENT AND PLAN:   Johnny Bowen  is a 83 y.o. male with a known history of Allergic rhinitis, coronary artery disease, diabetes type 2, GERD, history of pacemaker, essential hypertension, Parkinson's disease who is brought in by his wife due to change in mental status. And shortness of breath. Patient for the  past few months has not been eating or drinking much. He was found to have elevated lactic acid level possible pneumonia  1. Sever Sepsis suspected due to possible pneumonia Continue Unasyn for now Wife wants to take him to home with hospicesTomorrow Would like to  continue antibiotics for now   2. Acute liver injury appears from severe sepsis with hypotension Differential diagnoses include possible hypotension, shock liver, Chronic choleastasis Continue to hold amiodarone, elquis and his cholesterol medications HIDA Scan was canceled per wife's request  3. Acute renal failure  Result   4. Atrial fibrillation  -holding amiodarone and eliquis -HR stable  5. Hyperlipidemia unspecified will hold cholesterol medication  6. TCP plt count 126---69 K (?sepsis) SCD for now  7.Elevated troponin suspect demand ischemia from sepsis and hypotension -supportive care  8. Acute respiratory failure Patient's oxygen requirement is less. Chest x-ray showed bilateral infiltrate   Appreciated palliative care input Case discussed with Care Management/Social Worker. Management plans discussed with the patient, family and they are in agreement.  CODE STATUS: DNR  DVT Prophylaxis: SCD due to drop in plts  TOTAL TIME TAKING CARE OF THIS PATIENT: 32 minutes.  >50% time spent on counselling and coordination of care pt and  wife POSSIBLE D/C TOMORROW WITH HOME HOSPICE  Note: This dictation was prepared with Dragon dictation along with smaller phrase technology. Any transcriptional errors that result from this process are unintentional.  Auburn Bilberry M.D on 01/29/2017 at 11:44 AM  Between 7am to 6pm - Pager - [phone]  After 6pm go to [email or web address] - Social research officer, government  [facility]  Office  [phone]  CC: Primary care physician; Mickey Farber, MD

## 2017-01-29 NOTE — Care Management Important Message (Signed)
Important Message  Patient Details  Name: Johnny Bowen MRN: [id number] Date of Birth: Apr 28, 1930   Medicare Important Message Given:  Yes    Gwenette Greet, RN 01/29/2017, 8:53 AM

## 2017-01-30 MED ORDER — AMOXICILLIN-POT CLAVULANATE 875-125 MG PO TABS: 1.0000 | ORAL_TABLET | Freq: Two times a day (BID) | ORAL | 0 refills | Status: AC

## 2017-01-30 MED ORDER — OXYCODONE HCL 5 MG PO TABS: 5.0000 mg | ORAL_TABLET | ORAL | 0 refills | Status: AC | PRN

## 2017-01-30 MED ORDER — AMOXICILLIN-POT CLAVULANATE 875-125 MG PO TABS: 1.0000 | ORAL_TABLET | Freq: Two times a day (BID) | ORAL | Status: DC

## 2017-01-30 NOTE — Progress Notes (Signed)
Patient is being discharged today. Discharge paperwork reviewed with the wife, she verified understanding. IV x2 removed. 1 paper prescription given to the wife. Patient is awaiting transport via EMS.

## 2017-01-30 NOTE — Progress Notes (Signed)
Pharmacy Antibiotic Note  Johnny Bowen is a 83 y.o. male admitted on 01/26/2017 with pneumonia.  Pharmacy has been consulted for Unasyn dosing. Patient is also ordered doxycycline 100mg  PO BID.   Plan: Unasyn 3 g IV Q6hr.   Height: 5\' 10"  (177.8 cm) Weight: 156 lb 15.5 oz (71.2 kg) IBW/kg (Calculated) : 73  Temp (24hrs), Avg:98.1 F (36.7 C), Min:97.9 F (36.6 C), Max:98.6 F (37 C)   Recent Labs Lab 01/26/17 1423 01/26/17 1706 01/27/17 0515 01/28/17 0511 01/29/17 0440  WBC 11.0*  --  8.4 9.1 6.1  CREATININE 2.38*  --  1.67* 1.09 0.70  LATICACIDVEN 4.1* 3.6*  --   --   --     Estimated Creatinine Clearance: 68 mL/min (by C-G formula based on SCr of 0.7 mg/dL).    Allergies  Allergen Reactions  . Atorvastatin     Other reaction(s): Other (See Comments) Restless leg syndrome  . Esomeprazole Magnesium     Other reaction(s): Other (See Comments) Stomach irritation    Antimicrobials this admission: Azithromycin 5/21 >> 5/21 Ceftriaxone 5/21 >> 5/21 Unasyn 5/22 >>  Doxycycline 5/22 >>  Dose adjustments this admission: N/A  Microbiology results: 5/21 BCx: no growth x 2  5/21 UCx: no growth   Thank you for allowing pharmacy to be a part of this patient's care.  Marty Heck 01/30/2017 8:52 AM

## 2017-01-30 NOTE — Discharge Instructions (Signed)
[facility] ° °DIET:  °Regular diet ° °DISCHARGE CONDITION:  °Stable ° °ACTIVITY:  °Activity as tolerated ° °OXYGEN:  °Home Oxygen: No. °  °Oxygen Delivery: room air ° °DISCHARGE LOCATION:  °home  ° ° °ADDITIONAL DISCHARGE INSTRUCTION: ° ° °If you experience worsening of your admission symptoms, develop shortness of breath, life threatening emergency, suicidal or homicidal thoughts you must seek medical attention immediately by calling 911 or calling your MD immediately  if symptoms less severe. ° °You Must read complete instructions/literature along with all the possible adverse reactions/side effects for all the Medicines you take and that have been prescribed to you. Take any new Medicines after you have completely understood and accpet all the possible adverse reactions/side effects.  ° °Please note ° °You were cared for by a hospitalist during your hospital stay. If you have any questions about your discharge medications or the care you received while you were in the hospital after you are discharged, you can call the unit and asked to speak with the hospitalist on call if the hospitalist that took care of you is not available. Once you are discharged, your primary care physician will handle any further medical issues. Please note that NO REFILLS for any discharge medications will be authorized once you are discharged, as it is imperative that you return to your primary care physician (or establish a relationship with a primary care physician if you do not have one) for your aftercare needs so that they can reassess your need for medications and monitor your lab values. ° ° °

## 2017-01-30 NOTE — Evaluation (Signed)
Physical Therapy Evaluation Patient Details Name: Johnny Bowen MRN: [id number] DOB: 1930/04/10 Today's Date: 01/30/2017   History of Present Illness  Johnny Bowen  is a 83 y.o. male with a known history of allergic rhinitis, coronary artery disease, diabetes type 2, GERD, history of pacemaker, essential hypertension, Parkinson's disease who is brought in by his wife due to change in mental status. And shortness of breath. Patient for the past few months has not been eating or drinking much. Today he seemed to be breathing heavily. Therefore his wife decided to bring him to the hospital. In the emergency room is noted to have elevated lactic acid level possible pneumonia. As well as severely elevated liver function tests as well as acute renal failure. Pt now admitted for severe sepsis secondary to possible PNA, acute liver injury with hypotension, elevated troponin likely from demand ischemia, and acute renal failure. Pt is very soft spoken but able to contribute to the conversation today.   Clinical Impression  Pt admitted with above diagnosis. Pt currently with functional limitations due to the deficits listed below (see PT Problem List).  Pt does fairly well with mobility today. Pt requires minA+1 and cues for rolling and to transition from L sidelying to sitting. Once upright pt maintains static balance independently but does sit with forward slumped posture. Able to correct with cues. Pt able to perform sit to stand transfers with minA+1. Good eccentric control with descent. Once upright pt is steady and stable in standing with UE support on rolling walker. Pt able to ambulate to door and back to recliner with CGA and rolling walker. Gait is slow with short shuffling steps. No DOE but pt does appears moderately fatigued following ambulation. Upon arrival he is on room air but SaO2 88-89% so pt placed back on supplemental O2 at 2L/min for duration of session. Pt appears to have all the equipment  he needs at this time with the exception of O2 and a hospital bed. He has a walk-in shower with a seat as well as a bedside commode. Pt has both a rollator and front-wheeled walker which he can utilize as well as a ramp to enter his home. He would benefit from [facility] PT to maintain as much of his functional independence as possible but this will have to be determined further by hospice. Pt will benefit from skilled PT services to address deficits in strength, balance, and mobility in order to return to full function at home.     Follow Up Recommendations Home health PT;Supervision for mobility/OOB    Equipment Recommendations  Other (comment);Hospital bed (Home O2, )    Recommendations for Other Services       Precautions / Restrictions Precautions Precautions: Fall Restrictions Weight Bearing Restrictions: No      Mobility  Bed Mobility Overal bed mobility: Needs Assistance Bed Mobility: Supine to Sit     Supine to sit: Min assist     General bed mobility comments: Pt requires some cues for roling and to push up from L sidelying to sitting. Once upright pt maintains static balance independently but does sit with forward slumped posture. Able to correct with cues.  Transfers Overall transfer level: Needs assistance Equipment used: Rolling walker (2 wheeled) Transfers: Sit to/from Stand Sit to Stand: Min assist         General transfer comment: Pt able to perform sit to stand transfers with minA+1. Good eccentric control with descent. Cues for safe hand placement. Once upright pt is steady  and stable in standing with UE support on rolling walker  Ambulation/Gait Ambulation/Gait assistance: Min guard Ambulation Distance (Feet): 40 Feet Assistive device: Rolling walker (2 wheeled) Gait Pattern/deviations: Decreased step length - right;Decreased step length - left Gait velocity: Decreased Gait velocity interpretation: <1.8 ft/sec, indicative of risk for recurrent falls General  Gait Details: Pt able to ambulate to door and back to recliner with CGA and rolling walker. Pt on 2 L/min O2 throughout session. Upon arrival he is on room air but SaO2 88-89% so pt placed back on supplemental O2. Gait is slow with short shuffling steps. No DOE but pt does appears moderately fatigued following ambulation  Stairs            Wheelchair Mobility    Modified Rankin (Stroke Patients Only)       Balance Overall balance assessment: Needs assistance Sitting-balance support: No upper extremity supported Sitting balance-Leahy Scale: Fair     Standing balance support: No upper extremity supported Standing balance-Leahy Scale: Fair Standing balance comment: Pt able to maintain standing balance for periods without UE support                             Pertinent Vitals/Pain Pain Assessment: No/denies pain    Home Living Family/patient expects to be discharged to:: Private residence Living Arrangements: Spouse/significant other Available Help at Discharge: Family Type of Home: House Home Access: Ramped entrance     Home Layout: One level Home Equipment: Hospital bed;Other (comment);Walker - 2 wheels;Bedside commode;Shower seat;Grab bars - tub/shower;Wheelchair - [employer] - 4 wheels (Hosp bed being delivered, no home O2)      Prior Function Level of Independence: Needs assistance   Gait / Transfers Assistance Needed: Limited community ambulation with 2 wheeled walker outside and rollator inside  ADL's / Homemaking Assistance Needed: Wife assists with bathing/dressing        Hand Dominance   Dominant Hand: Right    Extremity/Trunk Assessment   Upper Extremity Assessment Upper Extremity Assessment: Overall WFL for tasks assessed    Lower Extremity Assessment Lower Extremity Assessment: Generalized weakness       Communication   Communication: Other (comment) (Soft spoken)  Cognition Arousal/Alertness: Lethargic Behavior During  Therapy: Flat affect Overall Cognitive Status: Within Functional Limits for tasks assessed                                        General Comments      Exercises     Assessment/Plan    PT Assessment Patient needs continued PT services  PT Problem List Decreased strength;Decreased balance;Decreased activity tolerance;Decreased mobility       PT Treatment Interventions DME instruction;Gait training;Stair training;Functional mobility training;Therapeutic activities;Therapeutic exercise;Balance training;Neuromuscular re-education;Patient/family education    PT Goals (Current goals can be found in the Care Plan section)  Acute Rehab PT Goals Patient Stated Goal: Return home PT Goal Formulation: With patient/family Time For Goal Achievement: 02/13/17 Potential to Achieve Goals: Good    Frequency Min 2X/week   Barriers to discharge        Co-evaluation               AM-PAC PT "6 Clicks" Daily Activity  Outcome Measure Difficulty turning over in bed (including adjusting bedclothes, sheets and blankets)?: Total Difficulty moving from lying on back to sitting on the side of the bed? :  Total Difficulty sitting down on and standing up from a chair with arms (e.g., wheelchair, bedside commode, etc,.)?: Total Help needed moving to and from a bed to chair (including a wheelchair)?: A Little Help needed walking in hospital room?: A Little Help needed climbing 3-5 steps with a railing? : A Lot 6 Click Score: 11    End of Session Equipment Utilized During Treatment: Gait belt;Oxygen;Other (comment) (2 L/min) Activity Tolerance: Patient tolerated treatment well Patient left: in chair;with call bell/phone within reach;with chair alarm set Nurse Communication: Mobility status PT Visit Diagnosis: Unsteadiness on feet (R26.81);Muscle weakness (generalized) (M62.81)    Time: 1497-0263 PT Time Calculation (min) (ACUTE ONLY): 32 min   Charges:   PT Evaluation $PT  Eval Low Complexity: 1 Procedure PT Treatments $Gait Training: 8-22 mins   PT G Codes:        Sharalyn Ink Shenica Holzheimer PT, DPT    Kyrstin Campillo 01/30/2017, 11:07 AM

## 2017-01-30 NOTE — Progress Notes (Signed)
Nutrition Brief Note  Chart reviewed. Patient now transitioning to comfort care.   No further nutrition interventions warranted at this time. Please consult RD as needed.   Graceanne Guin, MS, RD, LDN Pager: [phone] After Hours Pager: [phone]    

## 2017-01-30 NOTE — Discharge Summary (Signed)
[facility] [place] [facility], 83 y.o., DOB 07/06/1930, MRN [id number]. Admission date: 01/26/2017 Discharge Date 01/30/2017 Primary MD Mickey Farber, MD Admitting Physician Auburn Bilberry, MD  Admission Diagnosis  Elevated liver enzymes [R74.8] Sepsis, due to unspecified organism [facility]) [A41.9] Altered mental status, unspecified altered mental status type [R41.82] Sepsis (HCC) [A41.9]  Discharge Diagnosis   Active Problems:   Sepsis (HCC) possibly due to pneumonia   Elevated liver enzymes   Acute encephalopathy due to sepsis and elevated liver function tests   Acute liver failure without hepatic coma possibly due to shock liver from hypotension   Palliative care encounter   Goals of care, counseling/discussion   Atrial fibrillation   hyperlipdemia   thrombocytopenia Elevated troponin due to demand ischemia  Acute respiratory failure due to pneumonia       Hospital Course Jatinder Buffkin  is a 83 y.o. male with a known history of Allergic rhinitis, coronary artery disease, diabetes type 2, GERD, history of pacemaker, essential hypertension, Parkinson's disease who is brought in by his wife due to change in mental status. And shortness of breath. Patient for the past few months has not been eating or drinking much. Today he seemed to be breathing heavily. Therefore his wife decided to bring him to the hospital. In the emergency room is noted to have elevated lactic acid level possible pneumonia. As well as severely elevated liver function tests as well as acute renal failure. In terms of his respiratory failure was felt to be due to pneumonia. Patient was hypotensive and had to be placed in the ICU requiring pressors. Subsequently he was transferred out blood pressure has been stable. He was noted to have acute elevated liver function test felt to be due to hypotension and sepsis. He was seen in consultation by GI and had a ultrasound of the liver which  showed no significant pathology. There was some concern for possible chronic cholecystitis. I ordered a HIDA scan. However patient and family decided not to get the HIDA scan. They did not want very aggressive therapy they wanted him to be going home with home hospice. The intent is not to bring him back to the hospital if things were to get worse. He'll be continued on antibiotics             Consults  GI  Significant Tests:  See full reports for all details     US Abdomen Complete  Result Date: 01/26/2017 CLINICAL DATA:  Acute onset of elevated LFTs.  Initial encounter. EXAM: ABDOMEN ULTRASOUND COMPLETE COMPARISON:  CT of the abdomen and pelvis from 08/09/2015 FINDINGS: Gallbladder: Diffuse gallbladder wall thickening is noted, measuring up to 6 mm, with mild echogenic sludge noted in the gallbladder. No definite stones are appreciated. No ultrasonographic Murphy's sign is elicited. No pericholecystic fluid is seen. Common bile duct: Diameter: 0.6 cm, within normal limits in caliber. Liver: No focal lesion identified. Scattered calcified granulomata are noted within the liver. Within normal limits in parenchymal echogenicity. IVC: No abnormality visualized. Pancreas: Not visualized. Spleen: Size and appearance within normal limits. Right Kidney: Length: 11.3 cm. Echogenicity within normal limits. No mass or hydronephrosis visualized. Left Kidney: Length: 11.0 cm. Echogenicity within normal limits. No mass or hydronephrosis visualized. Abdominal aorta: No aneurysm visualized. Partially obscured by overlying bowel gas. Other findings: A small right pleural effusion is noted. IMPRESSION: 1. Diffuse gallbladder wall thickening, measuring up to 6 mm, with mild echogenic sludge. No ultrasonographic Murphy's sign  elicited. This may reflect chronic inflammation. 2. Small right pleural effusion. Electronically Signed   By: Roanna Raider M.D.   On: 01/26/2017 18:05   Dg Chest Port 1 View  Result  Date: 01/28/2017 CLINICAL DATA:  Respiratory failure. EXAM: PORTABLE CHEST 1 VIEW COMPARISON:  01/27/2017. FINDINGS: Cardiac pacer stable position. Cardiomegaly with diffuse bilateral pulmonary infiltrates/edema. No interim change. No pleural effusion or pneumothorax. IMPRESSION: 1. Cardiac pacer stable position. 2. Cardiomegaly with bilateral pulmonary infiltrates/ edema. No interim change from prior exam . Electronically Signed   By: Maisie Fus  Register   On: 01/28/2017 07:26   Dg Chest Port 1 View  Result Date: 01/27/2017 CLINICAL DATA:  Respiratory failure. EXAM: PORTABLE CHEST 1 VIEW COMPARISON:  01/26/2017. FINDINGS: Cardiac pacer in stable position. Cardiomegaly with diffuse bilateral pulmonary infiltrates/edema. No interim change. No pleural effusion or pneumothorax . IMPRESSION: 1.  Stable cardiomegaly.  Cardiac pacer in stable position. 2. Diffuse bilateral pulmonary infiltrates/ edema. No interim change. Electronically Signed   By: Maisie Fus  Register   On: 01/27/2017 06:34   Dg Chest Port 1 View  Result Date: 01/26/2017 CLINICAL DATA:  Altered mental status EXAM: PORTABLE CHEST 1 VIEW COMPARISON:  03/17/2016 FINDINGS: Cardiomegaly and vascular pedicle widening. Diffuse interstitial and airspace opacity. No effusion or pneumothorax. Dual-chamber pacer leads from the left in stable position. IMPRESSION: Diffuse symmetric lung opacity favoring CHF. Electronically Signed   By: Marnee Spring M.D.   On: 01/26/2017 14:42       Today   Subjective:   Darl Pikes  patient denies any complaints and states she is ready to go home denies any abdominal pain nausea vomiting or diarrhea  Objective:   Blood pressure (!) 109/43, pulse 90, temperature 97.9 F (36.6 C), resp. rate 20, height 5\' 10"  (1.778 m), weight 156 lb 15.5 oz (71.2 kg), SpO2 100 %.  .  Intake/Output Summary (Last 24 hours) at 01/30/17 1252 Last data filed at 01/30/17 0955  Gross per 24 hour  Intake              420 ml  Output               550 ml  Net             -130 ml    Exam VITAL SIGNS: Blood pressure (!) 109/43, pulse 90, temperature 97.9 F (36.6 C), resp. rate 20, height 5\' 10"  (1.778 m), weight 156 lb 15.5 oz (71.2 kg), SpO2 100 %.  GENERAL:  83 y.o.-year-old patient lying in the bed with no acute distress.  EYES: Pupils equal, round, reactive to light and accommodation. No scleral icterus. Extraocular muscles intact.  HEENT: Head atraumatic, normocephalic. Oropharynx and nasopharynx clear.  NECK:  Supple, no jugular venous distention. No thyroid enlargement, no tenderness.  LUNGS: Normal breath sounds bilaterally, no wheezing, rales,rhonchi or crepitation. No use of accessory muscles of respiration.  CARDIOVASCULAR: S1, S2 normal. No murmurs, rubs, or gallops.  ABDOMEN: Soft, nontender, nondistended. Bowel sounds present. No organomegaly or mass.  EXTREMITIES: No pedal edema, cyanosis, or clubbing.  NEUROLOGIC: Cranial nerves II through XII are intact. Muscle strength 5/5 in all extremities. Sensation intact. Gait not checked.  PSYCHIATRIC: The patient is alert and oriented x 3.  SKIN: No obvious rash, lesion, or ulcer.   Data Review     CBC w Diff:  Lab Results  Component Value Date   WBC 6.1 01/29/2017   HGB 10.2 (L) 01/29/2017   HGB 13.1 06/28/2013  HCT 31.6 (L) 01/29/2017   HCT 37.6 (L) 06/28/2013   PLT 66 (L) 01/29/2017   PLT 131 (L) 06/28/2013   LYMPHOPCT 3 01/26/2017   LYMPHOPCT 30.1 06/28/2013   MONOPCT 6 01/26/2017   MONOPCT 7.4 06/28/2013   EOSPCT 0 01/26/2017   EOSPCT 2.0 06/28/2013   BASOPCT 0 01/26/2017   BASOPCT 0.5 06/28/2013   CMP:  Lab Results  Component Value Date   NA 145 01/29/2017   NA 139 06/28/2013   K 3.4 (L) 01/29/2017   K 4.0 06/28/2013   CL 108 01/29/2017   CL 106 06/28/2013   CO2 31 01/29/2017   CO2 28 06/28/2013   BUN 33 (H) 01/29/2017   BUN 17 06/28/2013   CREATININE 0.70 01/29/2017   CREATININE 0.86 06/28/2013   PROT 5.3 (L) 01/29/2017    PROT 6.5 06/27/2013   ALBUMIN 2.6 (L) 01/29/2017   ALBUMIN 3.4 06/27/2013   BILITOT 1.8 (H) 01/29/2017   BILITOT 0.5 06/27/2013   ALKPHOS 58 01/29/2017   ALKPHOS 82 06/27/2013   AST 566 (H) 01/29/2017   AST 20 06/27/2013   ALT 72 (H) 01/29/2017   ALT 23 06/27/2013  .  Micro Results Recent Results (from the past 240 hour(s))  Blood Culture (routine x 2)     Status: None (Preliminary result)   Collection Time: 01/26/17  2:23 PM  Result Value Ref Range Status   Specimen Description BLOOD L ARM  Final   Special Requests   Final    BOTTLES DRAWN AEROBIC AND ANAEROBIC Blood Culture adequate volume   Culture NO GROWTH 4 DAYS  Final   Report Status PENDING  Incomplete  Blood Culture (routine x 2)     Status: None (Preliminary result)   Collection Time: 01/26/17  2:24 PM  Result Value Ref Range Status   Specimen Description BLOOD R ARM  Final   Special Requests   Final    BOTTLES DRAWN AEROBIC AND ANAEROBIC Blood Culture results may not be optimal due to an excessive volume of blood received in culture bottles   Culture NO GROWTH 4 DAYS  Final   Report Status PENDING  Incomplete  Urine culture     Status: None   Collection Time: 01/26/17  3:05 PM  Result Value Ref Range Status   Specimen Description URINE, RANDOM  Final   Special Requests NONE  Final   Culture   Final    NO GROWTH Performed at [facility], [address], [place], [place] [address]    Report Status 01/27/2017 FINAL  Final        Code Status Orders        Start     Ordered   01/26/17 1807  Do not attempt resuscitation (DNR)  Continuous    Question Answer Comment  In the event of cardiac or respiratory ARREST Do not call a "code blue"   In the event of cardiac or respiratory ARREST Do not perform Intubation, CPR, defibrillation or ACLS   In the event of cardiac or respiratory ARREST Use medication by any route, position, wound care, and other measures to relive pain and suffering. May use oxygen,  suction and manual treatment of airway obstruction as needed for comfort.      01/26/17 1806    Code Status History    Date Active Date Inactive Code Status Order ID Comments User Context   This patient has a current code status but no historical code status.  Advance Directive Documentation     Most Recent Value  Type of Advance Directive  Healthcare Power of Attorney, Living will  Pre-existing out of facility DNR order (yellow form or pink MOST form)  -  "MOST" Form in Place?  -            Discharge Medications   Allergies as of 01/30/2017      Reactions   Atorvastatin    Other reaction(s): Other (See Comments) Restless leg syndrome   Esomeprazole Magnesium    Other reaction(s): Other (See Comments) Stomach irritation      Medication List    STOP taking these medications   amiodarone 200 MG tablet Commonly known as:  PACERONE   carbidopa-levodopa 25-100 MG tablet Commonly known as:  SINEMET IR     TAKE these medications   amoxicillin-clavulanate 875-125 MG tablet Commonly known as:  AUGMENTIN Take 1 tablet by mouth every 12 (twelve) hours.   doxepin 75 MG capsule Commonly known as:  SINEQUAN 150 mg at bedtime as needed.   ELIQUIS 5 MG Tabs tablet Generic drug:  apixaban Take 5 mg by mouth 2 (two) times daily.   fluticasone 50 MCG/ACT nasal spray Commonly known as:  FLONASE Place 1 spray into both nostrils daily.   latanoprost 0.005 % ophthalmic solution Commonly known as:  XALATAN Place 1 drop into both eyes at bedtime.   metFORMIN 500 MG tablet Commonly known as:  GLUCOPHAGE Take 500 mg by mouth 2 (two) times daily with a meal.   nitroGLYCERIN 0.4 MG SL tablet Commonly known as:  NITROSTAT Place 0.4 mg under the tongue every 5 (five) minutes as needed.   omeprazole 20 MG capsule Commonly known as:  PRILOSEC Take 20 mg by mouth daily.   oxyCODONE 5 MG immediate release tablet Commonly known as:  Oxy IR/ROXICODONE 1 po tid prn Earliest  Fill Date: 07/25/15 What changed:  Another medication with the same name was added. Make sure you understand how and when to take each.   oxyCODONE 5 MG immediate release tablet Commonly known as:  Oxy IR/ROXICODONE Take 1 tablet (5 mg total) by mouth every 3 (three) hours as needed for moderate pain. What changed:  You were already taking a medication with the same name, and this prescription was added. Make sure you understand how and when to take each.   pramipexole 1 MG tablet Commonly known as:  MIRAPEX Take 2 mg by mouth 2 (two) times daily. What changed:  Another medication with the same name was removed. Continue taking this medication, and follow the directions you see here.   pravastatin 40 MG tablet Commonly known as:  PRAVACHOL Take 40 mg by mouth daily.   pregabalin 100 MG capsule Commonly known as:  LYRICA 1 po qHS          Total Time in preparing paper work, data evaluation and todays exam - 35 minutes  Auburn Bilberry M.D on 01/30/2017 at 12:52 PM  [facility]   Office  [phone]

## 2017-01-30 NOTE — Care Management (Signed)
Gave wife, Yovan Minnick personnel care services list. States she has a long term policy that she is planning on using for her personnel care services.  Possible discharge to home today, Will be followed by [facility]. Transportation will be arranged per [facility] Gwenette Greet RN MSN CCM Care Management [phone]

## 2017-01-31 LAB — CULTURE, BLOOD (ROUTINE X 2)
CULTURE: NO GROWTH
Culture: NO GROWTH
SPECIAL REQUESTS: ADEQUATE

## 2017-03-08 DEATH — deceased

## 2019-04-28 IMAGING — US US ABDOMEN COMPLETE
1 series · 13 of 25 positions shown · non-contrast
Comparison: CT of the abdomen and pelvis from 08/09/2015

CLINICAL DATA: Acute onset of elevated LFTs.  Initial encounter.

EXAM:
ABDOMEN ULTRASOUND COMPLETE

[Series 1: us abdomen complete · 0.22mm/px · 13 of 83 slices shown]
[im 1/83]
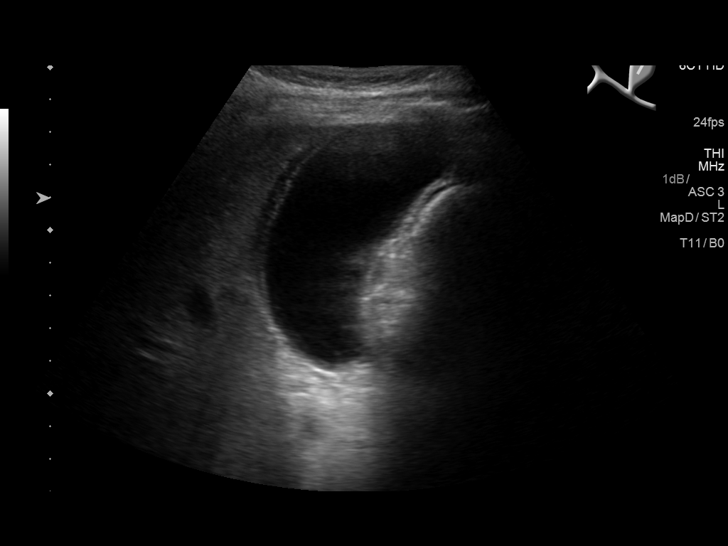
[im 7/83]
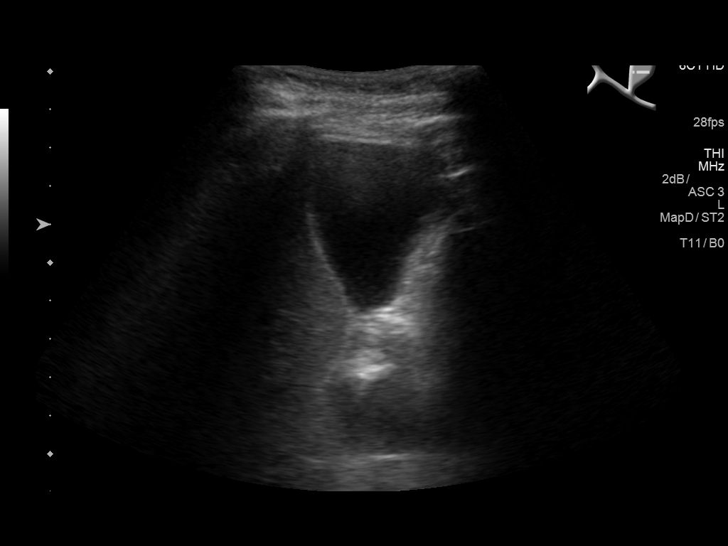
[im 14/83]
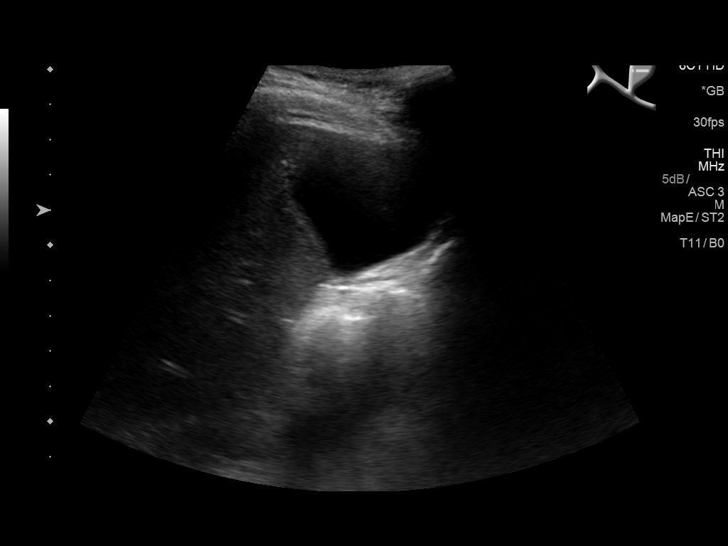
[im 21/83]
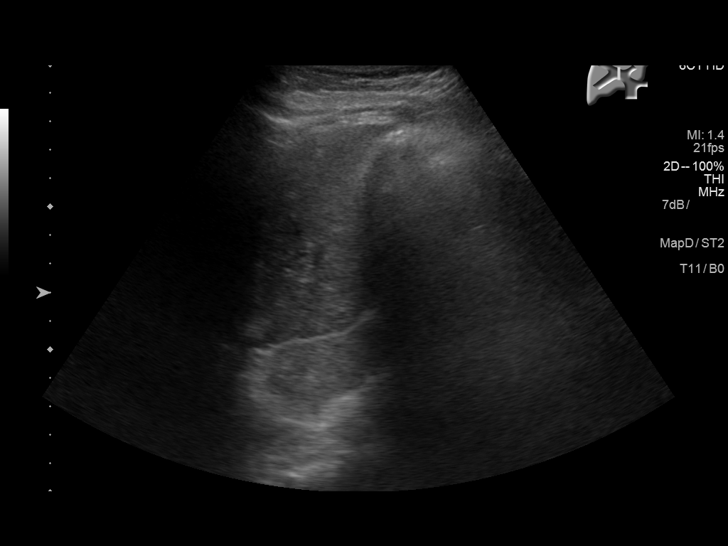
[im 28/83]
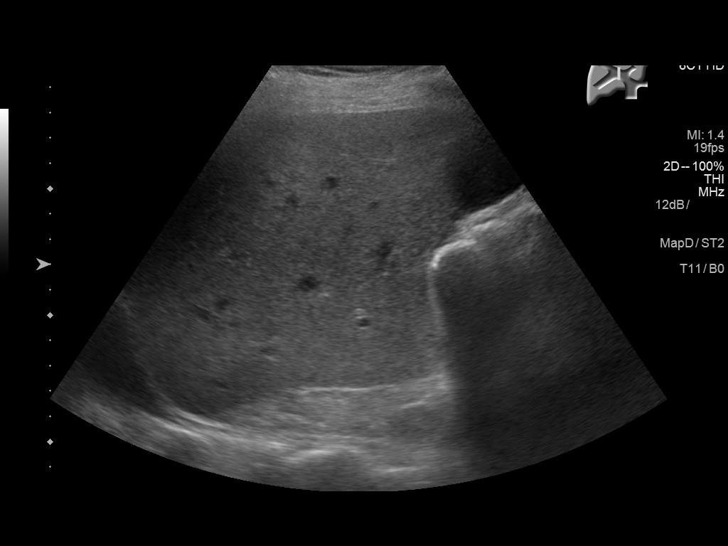
[im 35/83]
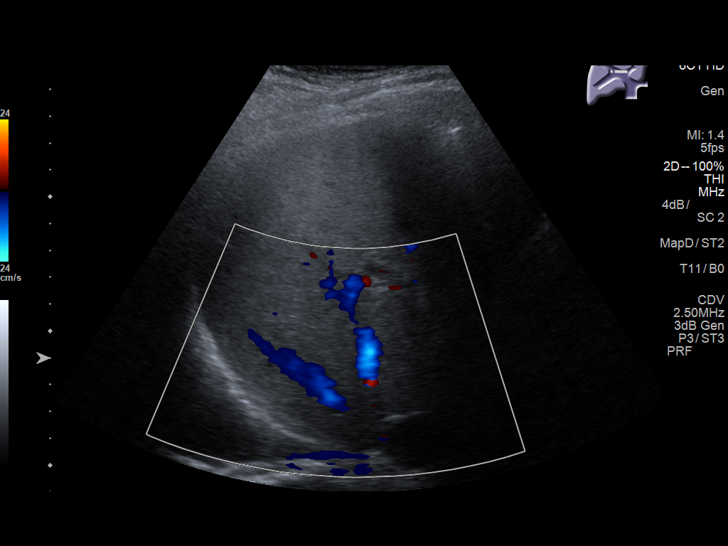
[im 42/83]
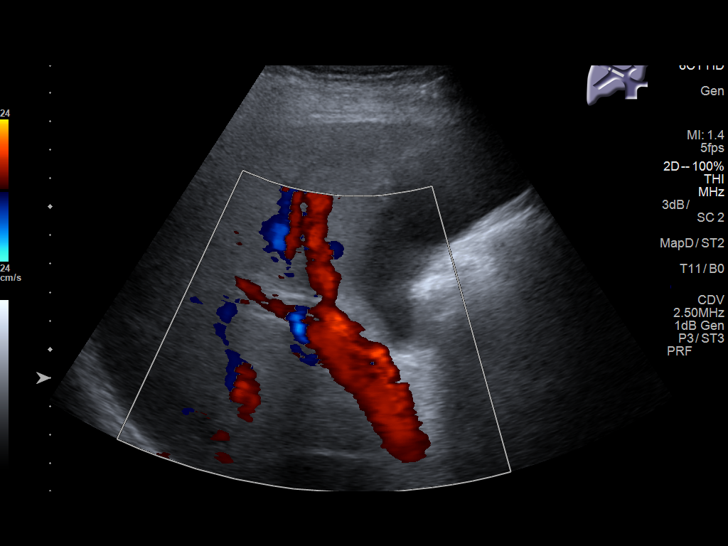
[im 48/83]
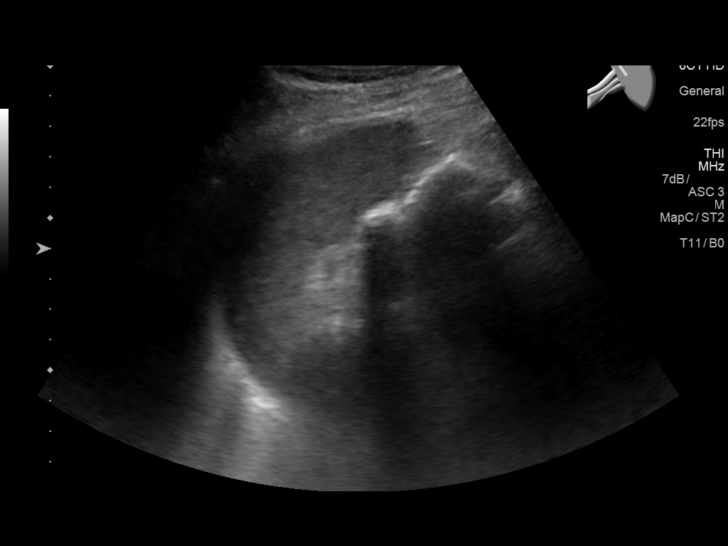
[im 55/83]
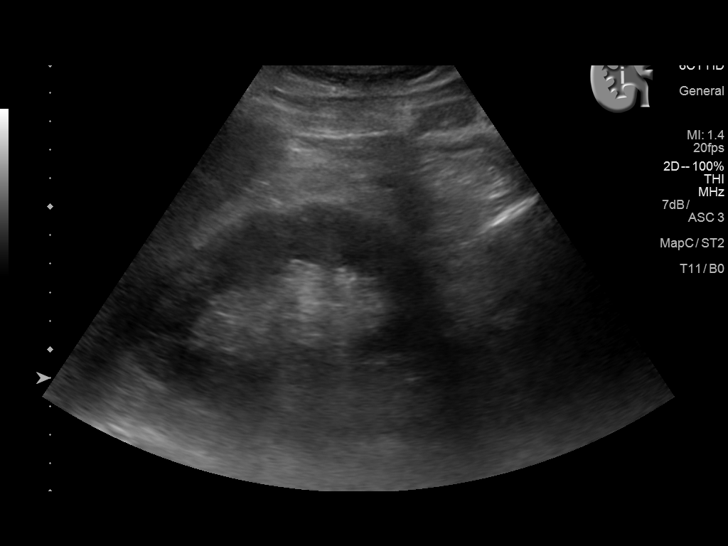
[im 62/83]
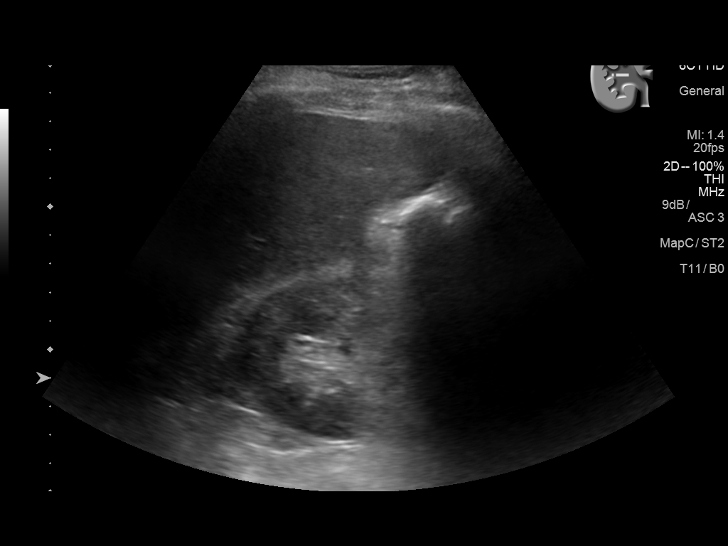
[im 69/83]
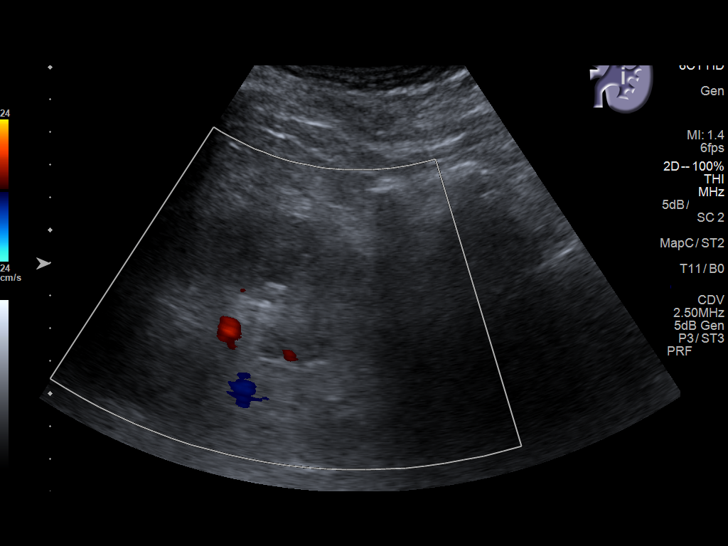
[im 76/83]
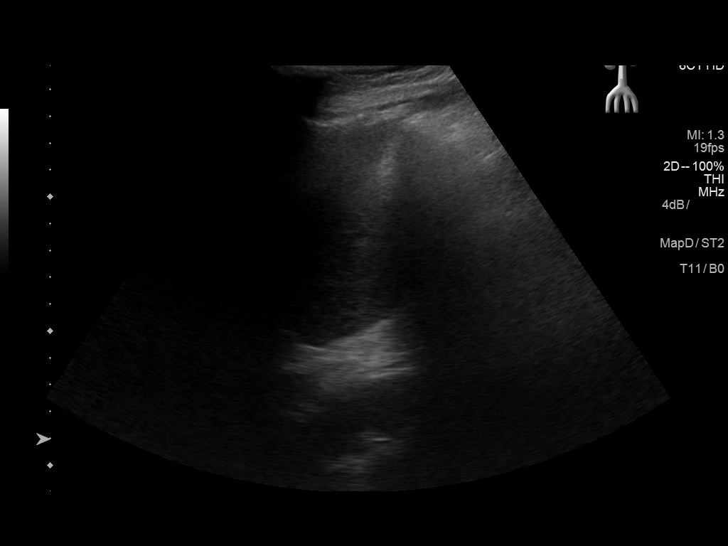
[im 83/83]
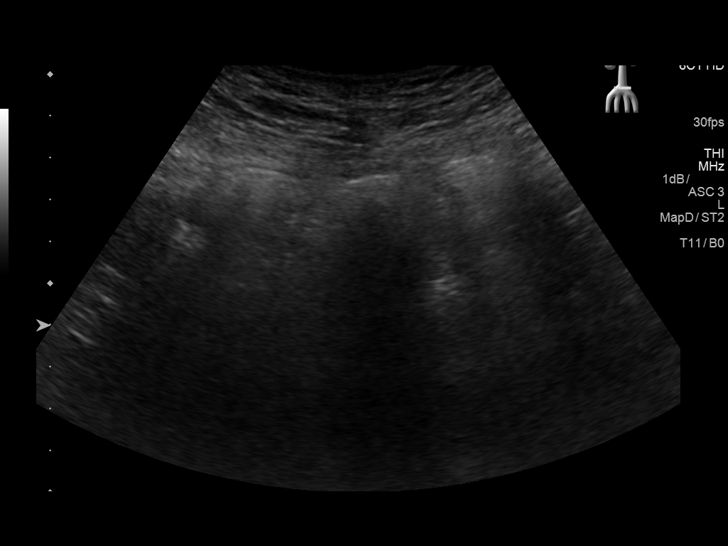

[13 of 25 positions shown; findings below may reference images not displayed]

FINDINGS: Gallbladder: Diffuse gallbladder wall thickening is noted, measuring
up to 6 mm, with mild echogenic sludge noted in the gallbladder. No
definite stones are appreciated. No ultrasonographic Murphy's sign
is elicited. No pericholecystic fluid is seen.

Common bile duct: Diameter: 0.6 cm, within normal limits in caliber.

Liver: No focal lesion identified. Scattered calcified granulomata
are noted within the liver. Within normal limits in parenchymal
echogenicity.

IVC: No abnormality visualized.

Pancreas: Not visualized.

Spleen: Size and appearance within normal limits.

Right Kidney: Length: 11.3 cm. Echogenicity within normal limits. No
mass or hydronephrosis visualized.

Left Kidney: Length: 11.0 cm. Echogenicity within normal limits. No
mass or hydronephrosis visualized.

Abdominal aorta: No aneurysm visualized. Partially obscured by
overlying bowel gas.

Other findings: A small right pleural effusion is noted.
IMPRESSION: 1. Diffuse gallbladder wall thickening, measuring up to 6 mm, with
mild echogenic sludge. No ultrasonographic Murphy's sign elicited.
This may reflect chronic inflammation.
2. Small right pleural effusion.
